# Patient Record
Sex: Male | Born: 1980 | Race: White | Hispanic: No | Marital: Single | Smoking: Never smoker
Health system: Southern US, Community
[De-identification: ages and names within clinical notes are randomized; demographics above are authoritative.]

## PROBLEM LIST (undated history)

## (undated) DIAGNOSIS — R51 Headache: Secondary | ICD-10-CM

## (undated) HISTORY — DX: Headache: R51

---

## 2012-11-22 ENCOUNTER — Other Ambulatory Visit: Payer: Self-pay

## 2012-11-22 DIAGNOSIS — F909 Attention-deficit hyperactivity disorder, unspecified type: Secondary | ICD-10-CM

## 2012-11-22 DIAGNOSIS — F988 Other specified behavioral and emotional disorders with onset usually occurring in childhood and adolescence: Secondary | ICD-10-CM

## 2012-11-22 MED ORDER — AMPHETAMINE-DEXTROAMPHETAMINE 30 MG PO TABS: ORAL_TABLET | ORAL | Status: DC

## 2012-11-22 NOTE — Telephone Encounter (Signed)
Mom called and wanted refill faxed to [facility] and them asked to mail hardcopy as well to: [facility] [address] [place], [place]  [address] Eric Zuniga

## 2012-12-24 ENCOUNTER — Other Ambulatory Visit: Payer: Self-pay

## 2012-12-24 DIAGNOSIS — F909 Attention-deficit hyperactivity disorder, unspecified type: Secondary | ICD-10-CM

## 2012-12-24 DIAGNOSIS — F988 Other specified behavioral and emotional disorders with onset usually occurring in childhood and adolescence: Secondary | ICD-10-CM

## 2012-12-24 MED ORDER — AMPHETAMINE-DEXTROAMPHETAMINE 30 MG PO TABS: ORAL_TABLET | ORAL | Status: DC

## 2012-12-24 NOTE — Telephone Encounter (Signed)
[name] lvm asking for refill for [place]

## 2013-01-27 ENCOUNTER — Other Ambulatory Visit: Payer: Self-pay

## 2013-01-27 DIAGNOSIS — F909 Attention-deficit hyperactivity disorder, unspecified type: Secondary | ICD-10-CM

## 2013-01-27 DIAGNOSIS — F988 Other specified behavioral and emotional disorders with onset usually occurring in childhood and adolescence: Secondary | ICD-10-CM

## 2013-01-27 MED ORDER — AMPHETAMINE-DEXTROAMPHETAMINE 30 MG PO TABS: ORAL_TABLET | ORAL | Status: DC

## 2013-01-27 NOTE — Telephone Encounter (Signed)
[name] lvm asking to fax to [facility] [phone] & mail to Attn Phil: [address] [facility] [address] [facility]. [place], [place] [address].

## 2013-02-26 ENCOUNTER — Telehealth: Payer: Self-pay

## 2013-02-26 DIAGNOSIS — F988 Other specified behavioral and emotional disorders with onset usually occurring in childhood and adolescence: Secondary | ICD-10-CM

## 2013-02-26 DIAGNOSIS — F909 Attention-deficit hyperactivity disorder, unspecified type: Secondary | ICD-10-CM

## 2013-02-26 MED ORDER — AMPHETAMINE-DEXTROAMPHETAMINE 30 MG PO TABS: ORAL_TABLET | ORAL | Status: DC

## 2013-02-26 NOTE — Telephone Encounter (Signed)
Rx  Is ready to be faxed and mailed. TG

## 2013-02-26 NOTE — Telephone Encounter (Signed)
[name] lvm asked that Rx be mailed to [facility] at [facility] and faxed:     [facility]    [address]    [place], [place]  [address]    Ph: [phone]    Fax: [phone]

## 2013-03-28 HISTORY — PX: REPLANTATION THUMB: SUR1233

## 2013-04-02 ENCOUNTER — Telehealth: Payer: Self-pay

## 2013-04-02 DIAGNOSIS — F988 Other specified behavioral and emotional disorders with onset usually occurring in childhood and adolescence: Secondary | ICD-10-CM

## 2013-04-02 DIAGNOSIS — F909 Attention-deficit hyperactivity disorder, unspecified type: Secondary | ICD-10-CM

## 2013-04-02 MED ORDER — AMPHETAMINE-DEXTROAMPHETAMINE 30 MG PO TABS: ORAL_TABLET | ORAL | Status: DC

## 2013-04-02 NOTE — Telephone Encounter (Signed)
[name] lvm asking for refills to be faxed and mailed to [facility], ATTN: Michele Mcalpine.

## 2013-04-02 NOTE — Telephone Encounter (Signed)
Demone's Rx is ready to be sent to the pharmacy. Thanks, Inetta Fermo

## 2013-04-02 NOTE — Telephone Encounter (Signed)
Faxed and mailed as requested.

## 2013-05-05 ENCOUNTER — Emergency Department: Payer: Self-pay | Admitting: Emergency Medicine

## 2013-05-06 SURGERY — NERVE, TENDON AND ARTERY REPAIR
Anesthesia: General | Laterality: Left

## 2013-05-07 ENCOUNTER — Inpatient Hospital Stay: Admit: 2013-05-07 | Payer: Self-pay | Admitting: Orthopedic Surgery

## 2013-05-12 ENCOUNTER — Telehealth: Payer: Self-pay

## 2013-05-12 DIAGNOSIS — F909 Attention-deficit hyperactivity disorder, unspecified type: Secondary | ICD-10-CM

## 2013-05-12 DIAGNOSIS — F988 Other specified behavioral and emotional disorders with onset usually occurring in childhood and adolescence: Secondary | ICD-10-CM

## 2013-05-12 MED ORDER — AMPHETAMINE-DEXTROAMPHETAMINE 30 MG PO TABS: ORAL_TABLET | ORAL | Status: DC

## 2013-05-12 NOTE — Telephone Encounter (Signed)
Called mom and let her know that I faxed the Rx as requested and also mailed.

## 2013-05-12 NOTE — Telephone Encounter (Signed)
Eric Zuniga called and said that she was at the pharmacy and the Rx was not there. She said that she called and left the request on a vm after scheduling the f/u appt last week. I apologized and said that I did not get the request and that it would be at the pharmacy later today. She expressed understanding. Eric Zuniga can be reached at [phone]. I will need to fax the Rx to Phil at [phone]. I will also need to mail it to him.

## 2013-06-12 ENCOUNTER — Telehealth: Payer: Self-pay

## 2013-06-12 DIAGNOSIS — F909 Attention-deficit hyperactivity disorder, unspecified type: Secondary | ICD-10-CM

## 2013-06-12 DIAGNOSIS — F988 Other specified behavioral and emotional disorders with onset usually occurring in childhood and adolescence: Secondary | ICD-10-CM

## 2013-06-12 MED ORDER — AMPHETAMINE-DEXTROAMPHETAMINE 30 MG PO TABS: ORAL_TABLET | ORAL | Status: DC

## 2013-06-12 NOTE — Telephone Encounter (Signed)
[name], mother, lvm stating that pt needed Rx mailed and faxed to [facility]. I called [name] to let her know we received her message and to check with her pharmacy later today.

## 2013-06-12 NOTE — Telephone Encounter (Signed)
Faxed and mailed as requested.

## 2013-06-12 NOTE — Telephone Encounter (Signed)
Signed.

## 2013-07-18 ENCOUNTER — Encounter: Payer: Self-pay | Admitting: Family

## 2013-07-18 DIAGNOSIS — F909 Attention-deficit hyperactivity disorder, unspecified type: Secondary | ICD-10-CM | POA: Insufficient documentation

## 2013-07-18 DIAGNOSIS — G43009 Migraine without aura, not intractable, without status migrainosus: Secondary | ICD-10-CM

## 2013-07-18 DIAGNOSIS — F988 Other specified behavioral and emotional disorders with onset usually occurring in childhood and adolescence: Secondary | ICD-10-CM

## 2013-07-21 ENCOUNTER — Ambulatory Visit: Payer: Self-pay | Admitting: Family

## 2013-07-21 ENCOUNTER — Ambulatory Visit (INDEPENDENT_AMBULATORY_CARE_PROVIDER_SITE_OTHER): Payer: PRIVATE HEALTH INSURANCE | Admitting: Family

## 2013-07-21 ENCOUNTER — Encounter: Payer: Self-pay | Admitting: Family

## 2013-07-21 VITALS — BP 120/70 | HR 68 | Ht 65.0 in | Wt 150.0 lb

## 2013-07-21 DIAGNOSIS — F909 Attention-deficit hyperactivity disorder, unspecified type: Secondary | ICD-10-CM

## 2013-07-21 DIAGNOSIS — G43009 Migraine without aura, not intractable, without status migrainosus: Secondary | ICD-10-CM

## 2013-07-21 DIAGNOSIS — F988 Other specified behavioral and emotional disorders with onset usually occurring in childhood and adolescence: Secondary | ICD-10-CM

## 2013-07-21 MED ORDER — AMPHETAMINE-DEXTROAMPHETAMINE 30 MG PO TABS: ORAL_TABLET | ORAL | Status: DC

## 2013-07-21 NOTE — Patient Instructions (Signed)
Continue your medication without change. Call me if you have any questions or concerns.  Please plan to return for follow up in 1 year or sooner if needed.

## 2013-07-21 NOTE — Progress Notes (Signed)
Patient: Eric Zuniga MRN: [id number] Sex: male DOB: 25-Jun-1981  Provider: Elveria Rising, NP Location of Care: [facility]  Note type: Routine return visit  History of Present Illness: Referral Source: Dr. Harlene Salts History from: patient Chief Complaint: ADHD/ADD/Migraines  Eric Zuniga is a 32 y.o. male with history of attention deficit disorder, inattentive type and rare migraine headaches. He is working as a Health visitor. He is taking and tolerating generic Adderall for his problems with attention and focus and says that his current dosage works well to control his symptoms.  He says that his appetite is good. He eats breakfast. He eats a small  lunch and then takes a dose afterwards to help him focus in the afternoon. He eats well at night. He says that he has no trouble sleeping well at night.  Eric denies headaches and says that it has been some time since he has had a migraine. He injured his thumb about 3 months ago while using a wood splitter and had surgery for that. He says that he is getting more normal range of motion and sensation back in his thumb now. 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 Eric has been otherwise healthy since last seen.   Review of Systems: 12 system review was unremarkable  Past Medical History  Diagnosis Date  . Headache(784.0)    Hospitalizations: no, Head Injury: yes, Nervous System Infections: no, Immunizations up to date: yes Past Medical History Comments: Patient suffered a head injury at the age of 8 due to a fall. He had an injury to his thumb early this year while using a wood splitter.   Surgical History Past Surgical History  Procedure Laterality Date  . Replantation thumb Left Aug. 2014    Family History family history includes Alzheimer's disease in his paternal grandmother; Heart attack in his paternal  grandfather. Family History is negative migraines, seizures, cognitive impairment, blindness, deafness, birth defects, chromosomal disorder, autism.  Social History History   Social History  . Marital Status: Single    Spouse Name: N/A    Number of Children: N/A  . Years of Education: N/A   Social History Main Topics  . Smoking status: Never Smoker   . Smokeless tobacco: Never Used  . Alcohol Use: No  . Drug Use: No  . Sexual Activity: Yes   Other Topics Concern  . None   Social History Narrative  . None   Educational level: university  Occupation: R Mining engineer Living with roommate   Hobbies/Interest: Coaches 2 soccer teams School comments: Eric Graduated from [employer] [place].  No Known Allergies  Physical Exam BP 120/70  Pulse 68  Ht 5\' 5"  (1.651 m)  Wt 150 lb (68.04 kg)  BMI 24.96 kg/m2 General: well developed, well nourished male, seated on exam table, in no evident distress Head: head  normocephalic and atraumatic.  Oropharynx benign. Ears, Nose and Throat: Oropharynx benign. Neck: supple with no carotid or supraclavicular bruits. Cardiovascular: regular rate and rhythm, no murmurs  Neurologic Exam  Mental Status: Awake and fully alert.  Oriented to place and time.  Recent and remote memory intact.  Attention span, concentration, and fund of knowledge appropriate.  Mood and affect appropriate. Cranial Nerves: Fundoscopic exam reveals sharp disc margins.  Pupils equal, briskly reactive to light.  Extraocular movements full without nystagmus.  Visual fields full to confrontation.  Hearing intact and symmetric  to finger rub.  Facial sensation intact.  Face, tongue, palate move normally and symmetrically.  Neck flexion and extension normal. Motor: Normal bulk and tone.  Normal strength in all tested extremity muscles. Sensory: Intact to touch and temperature in all extremities except for distal aspect left thumb where he has subjective diminished sensation  from the recent injury Coordination: Rapid alternating movements normal in all extremities.  Finger-to-nose and heel-to-shin performed accurately bilaterally. Romberg negative. Gait and Station: Arises from chair without difficulty.  Stance is normal.  Gait demonstrates normal stride length and balance.  Able to heel, toe, and tandem walk without difficulty. Reflexes: Diminished and symmetric.  Toes downgoing.  Assessment and Plan Eric is a 32 year old young man with history of attention deficit disorder, inattentive type and rare migraine headaches. He is doing well on his medication and we will make no changes in his treatment plan at this time. His weight is stable and he is mindful of need to not skip meals during the day. He will return for follow up in 1 year or sooner if needed.   Meds ordered this encounter  Medications  . amphetamine-dextroamphetamine (ADDERALL) 30 MG tablet    Sig: Take 1 tab by mouth in the morning and 1/2 tab by mouth in the afternoon    Dispense:  62 tablet    Refill:  0

## 2013-08-04 ENCOUNTER — Ambulatory Visit: Payer: Self-pay | Admitting: Family

## 2013-08-19 ENCOUNTER — Telehealth: Payer: Self-pay

## 2013-08-19 DIAGNOSIS — F988 Other specified behavioral and emotional disorders with onset usually occurring in childhood and adolescence: Secondary | ICD-10-CM

## 2013-08-19 DIAGNOSIS — F909 Attention-deficit hyperactivity disorder, unspecified type: Secondary | ICD-10-CM

## 2013-08-19 MED ORDER — AMPHETAMINE-DEXTROAMPHETAMINE 30 MG PO TABS: ORAL_TABLET | ORAL | Status: DC

## 2013-08-19 NOTE — Telephone Encounter (Signed)
[name], mom, lvm asking for Rx to be mailed to [facility] at [facility] and fax as well. I called mom and lvm letting her know we received her vm and will do it today.

## 2013-09-19 ENCOUNTER — Telehealth: Payer: Self-pay

## 2013-09-19 DIAGNOSIS — F988 Other specified behavioral and emotional disorders with onset usually occurring in childhood and adolescence: Secondary | ICD-10-CM

## 2013-09-19 DIAGNOSIS — F909 Attention-deficit hyperactivity disorder, unspecified type: Secondary | ICD-10-CM

## 2013-09-19 MED ORDER — AMPHETAMINE-DEXTROAMPHETAMINE 30 MG PO TABS: ORAL_TABLET | ORAL | Status: DC

## 2013-09-19 NOTE — Telephone Encounter (Signed)
[name], mom called, please fax generic Adderall Rx to [place] at [facility] and put hard copy in mail. Called mom to confirm receipt of message.

## 2013-09-19 NOTE — Telephone Encounter (Signed)
Faxed and mailed as requested.

## 2013-10-28 ENCOUNTER — Telehealth: Payer: Self-pay

## 2013-10-28 DIAGNOSIS — F988 Other specified behavioral and emotional disorders with onset usually occurring in childhood and adolescence: Secondary | ICD-10-CM

## 2013-10-28 DIAGNOSIS — F909 Attention-deficit hyperactivity disorder, unspecified type: Secondary | ICD-10-CM

## 2013-10-28 MED ORDER — AMPHETAMINE-DEXTROAMPHETAMINE 30 MG PO TABS: ORAL_TABLET | ORAL | Status: DC

## 2013-10-28 NOTE — Telephone Encounter (Signed)
Faxed and mailed as requested.

## 2013-10-28 NOTE — Telephone Encounter (Signed)
[name], mom, lvm asking that refill authorization be sent to pharmacy and hard copy mailed to [place] at [facility]. I called mom and lvm letting her know it would be done today.

## 2013-12-03 ENCOUNTER — Telehealth: Payer: Self-pay

## 2013-12-03 DIAGNOSIS — F988 Other specified behavioral and emotional disorders with onset usually occurring in childhood and adolescence: Secondary | ICD-10-CM

## 2013-12-03 DIAGNOSIS — F909 Attention-deficit hyperactivity disorder, unspecified type: Secondary | ICD-10-CM

## 2013-12-03 MED ORDER — AMPHETAMINE-DEXTROAMPHETAMINE 30 MG PO TABS: ORAL_TABLET | ORAL | Status: DC

## 2013-12-03 NOTE — Telephone Encounter (Signed)
[name], mom, lvm asking for refill to be sent to pharmacy via fax and mail hard copy to [facility]. I have addressed envelope in my office. Called mom to let her know we received msg and will send Rx today.

## 2013-12-03 NOTE — Telephone Encounter (Signed)
Faxed and mailed.

## 2014-01-01 ENCOUNTER — Other Ambulatory Visit: Payer: Self-pay

## 2014-01-01 DIAGNOSIS — F988 Other specified behavioral and emotional disorders with onset usually occurring in childhood and adolescence: Secondary | ICD-10-CM

## 2014-01-01 DIAGNOSIS — F909 Attention-deficit hyperactivity disorder, unspecified type: Secondary | ICD-10-CM

## 2014-01-01 MED ORDER — AMPHETAMINE-DEXTROAMPHETAMINE 30 MG PO TABS: ORAL_TABLET | ORAL | Status: DC

## 2014-01-01 NOTE — Telephone Encounter (Signed)
[name], mom, lvm asking for Rx for pt's generic Adderall 30 mg to be faxed to [facility] [phone] and mail original. Called [name] and lvm let her know it will be done today.

## 2014-01-01 NOTE — Telephone Encounter (Signed)
Faxed and mailed to pharmacy.

## 2014-02-02 ENCOUNTER — Telehealth: Payer: Self-pay

## 2014-02-02 DIAGNOSIS — F909 Attention-deficit hyperactivity disorder, unspecified type: Secondary | ICD-10-CM

## 2014-02-02 DIAGNOSIS — F988 Other specified behavioral and emotional disorders with onset usually occurring in childhood and adolescence: Secondary | ICD-10-CM

## 2014-02-02 MED ORDER — AMPHETAMINE-DEXTROAMPHETAMINE 30 MG PO TABS: ORAL_TABLET | ORAL | Status: DC

## 2014-02-02 NOTE — Telephone Encounter (Signed)
Faxed and mailed as requested.

## 2014-02-02 NOTE — Telephone Encounter (Signed)
[name], mom, lvm asking for pt's Rx for generic Adderall to be faxed to pharmacy, mail original to pharmacy. I called [name] and let her know the Rx will be sent.

## 2014-03-06 ENCOUNTER — Telehealth: Payer: Self-pay

## 2014-03-06 DIAGNOSIS — F909 Attention-deficit hyperactivity disorder, unspecified type: Secondary | ICD-10-CM

## 2014-03-06 DIAGNOSIS — F988 Other specified behavioral and emotional disorders with onset usually occurring in childhood and adolescence: Secondary | ICD-10-CM

## 2014-03-06 MED ORDER — AMPHETAMINE-DEXTROAMPHETAMINE 30 MG PO TABS: ORAL_TABLET | ORAL | Status: DC

## 2014-03-06 NOTE — Telephone Encounter (Signed)
This encounter was created in error - please disregard.

## 2014-03-06 NOTE — Telephone Encounter (Signed)
[name], mom, lvm asking for pt's Rx to be faxed and then mailed to [facility]. Called [name] and let her know I received the vm and that we will send out today as requested.

## 2014-03-06 NOTE — Telephone Encounter (Signed)
Faxed and mailed as requested.

## 2014-04-08 ENCOUNTER — Other Ambulatory Visit: Payer: Self-pay | Admitting: Family

## 2014-04-08 DIAGNOSIS — F988 Other specified behavioral and emotional disorders with onset usually occurring in childhood and adolescence: Secondary | ICD-10-CM

## 2014-04-08 DIAGNOSIS — F909 Attention-deficit hyperactivity disorder, unspecified type: Secondary | ICD-10-CM

## 2014-04-08 MED ORDER — AMPHETAMINE-DEXTROAMPHETAMINE 30 MG PO TABS: ORAL_TABLET | ORAL | Status: DC

## 2014-04-08 NOTE — Telephone Encounter (Signed)
Mom left a message requesting that the Rx be faxed and mailed to [facility] in [place]. I faxed and mailed the Rx as requested. TG

## 2014-05-08 ENCOUNTER — Other Ambulatory Visit: Payer: Self-pay | Admitting: Family

## 2014-05-08 DIAGNOSIS — F909 Attention-deficit hyperactivity disorder, unspecified type: Secondary | ICD-10-CM

## 2014-05-08 DIAGNOSIS — F988 Other specified behavioral and emotional disorders with onset usually occurring in childhood and adolescence: Secondary | ICD-10-CM

## 2014-05-08 MED ORDER — AMPHETAMINE-DEXTROAMPHETAMINE 30 MG PO TABS: ORAL_TABLET | ORAL | Status: DC

## 2014-05-08 NOTE — Telephone Encounter (Signed)
Eric Zuniga left message for Eric Zuniga that he needs Rx for generic Adderall faxed and mailed to [facility] can be reached at [phone]. Rx faxed and mailed as requested. TG

## 2014-06-15 ENCOUNTER — Telehealth: Payer: Self-pay | Admitting: Family

## 2014-06-15 DIAGNOSIS — F901 Attention-deficit hyperactivity disorder, predominantly hyperactive type: Secondary | ICD-10-CM

## 2014-06-15 DIAGNOSIS — F988 Other specified behavioral and emotional disorders with onset usually occurring in childhood and adolescence: Secondary | ICD-10-CM

## 2014-06-15 MED ORDER — AMPHETAMINE-DEXTROAMPHETAMINE 30 MG PO TABS: ORAL_TABLET | ORAL | Status: DC

## 2014-06-15 NOTE — Telephone Encounter (Signed)
Mom Eric Zuniga left message for Eric Zuniga. She asked for his generic Adderall Rx to be faxed & mailed to [facility] Attention Phil. Rx faxed and mailed as requested. TG

## 2014-07-16 ENCOUNTER — Telehealth: Payer: Self-pay

## 2014-07-16 DIAGNOSIS — F988 Other specified behavioral and emotional disorders with onset usually occurring in childhood and adolescence: Secondary | ICD-10-CM

## 2014-07-16 DIAGNOSIS — F901 Attention-deficit hyperactivity disorder, predominantly hyperactive type: Secondary | ICD-10-CM

## 2014-07-16 MED ORDER — AMPHETAMINE-DEXTROAMPHETAMINE 30 MG PO TABS: ORAL_TABLET | ORAL | Status: DC

## 2014-07-16 NOTE — Telephone Encounter (Signed)
Please ask if he could be here at 3:45 to check in on 12/7 or 12/17. If he can, I could see him at 4 either of those days. Thanks, Otila Kluver

## 2014-07-16 NOTE — Telephone Encounter (Signed)
[name] called back and lvm and said that she misunderstood [place] and he can only come in in the morning on 08/13/14, requested 8 am appt. I called [name] back and let her know that I have moved the appt to the 8 15 am slot w arrival time at 8 am. I asked that she call me back to confirm.

## 2014-07-16 NOTE — Telephone Encounter (Signed)
Eric Zuniga is scheduled for Dec 17th @ 4pm. Thanks, Otila Kluver

## 2014-07-16 NOTE — Telephone Encounter (Signed)
Called [name] to let her know Rx was being faxed and mailed. She said that he would like the appt on 08/13/14 @ 345 pm for a 4 pm.

## 2014-07-16 NOTE — Telephone Encounter (Signed)
[name], mom, called requesting pt's generic adderall Rx be faxed to [place] at [facility], mail hard copy to [employer]. [name] said that Eric Zuniga needs a late afternoon appt for f/u, as close to 4 pm as possible. He is available 12/7, 12/8, 12/17. Otila Kluver, there aren't any appts this late in the afternoon. Please advise and I will schedule when I call mom back. [phone].

## 2014-07-17 NOTE — Telephone Encounter (Signed)
Spoke with [name] and she said that patient will be out of town, she forgot about this yesterday. She asked about the last week of December, 4 pm appt. I let her know Otila Kluver was out of the office that week. [name] asked if Dr. Lemmie Evens had any openings during that week. Scheduled him for 08/24/14 @ 345 check in for 4 pm appt w Dr.H.

## 2014-08-13 ENCOUNTER — Ambulatory Visit: Payer: PRIVATE HEALTH INSURANCE | Admitting: Family

## 2014-08-24 ENCOUNTER — Encounter: Payer: Self-pay | Admitting: Pediatrics

## 2014-08-24 ENCOUNTER — Ambulatory Visit (INDEPENDENT_AMBULATORY_CARE_PROVIDER_SITE_OTHER): Payer: Managed Care, Other (non HMO) | Admitting: Pediatrics

## 2014-08-24 DIAGNOSIS — F900 Attention-deficit hyperactivity disorder, predominantly inattentive type: Secondary | ICD-10-CM

## 2014-08-24 DIAGNOSIS — G43009 Migraine without aura, not intractable, without status migrainosus: Secondary | ICD-10-CM

## 2014-08-24 DIAGNOSIS — F988 Other specified behavioral and emotional disorders with onset usually occurring in childhood and adolescence: Secondary | ICD-10-CM

## 2014-08-24 DIAGNOSIS — F901 Attention-deficit hyperactivity disorder, predominantly hyperactive type: Secondary | ICD-10-CM

## 2014-08-24 MED ORDER — AMPHETAMINE-DEXTROAMPHETAMINE 30 MG PO TABS: ORAL_TABLET | ORAL | Status: DC

## 2014-08-24 NOTE — Progress Notes (Signed)
Patient: Eric Zuniga MRN: [id number] Sex: male DOB: April 01, 1981  Provider: Jodi Geralds, MD Location of Care: [facility]  Note type: Routine return visit  History of Present Illness: Referral Source: Dr. Amaryllis Dyke History from: patient and [facility] chart Chief Complaint: ADHD/ADD/Migraines  Eric Zuniga is a 33 y.o. male who returns August 16, 2014, for the first time since July 21, 2013.  He has a history of attention deficit disorder inattentive type and migraine headaches.  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  He has taken and tolerated generic Adderall morning and afternoon on work days and less often on weekends (only when he has to work).  He is quite aware when he does not take the medication that he has issues with focus and makes errors, which can be problematic in his line of work.  His general health has been good.  He has no problems sleeping and a normal appetite.  He has lost nearly three pounds in the last year and looks well.  He has not experienced headaches particularly migraines in quite some time.  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  He played soccer in high school and college.  The only other concern today is that his blood pressure was minimally high.  He admits to having a diet that is fairly heavy in salt.  The patient has no primary care physician.  A 33 years of age, it is necessary for him to develop relationship with the primary care physician, and possibly initiate transition of care to provide his Adderall.  Unless or until we can create that arrangement, we will continue to see him on a yearly basis.  Review of Systems: 12 system review was unremarkable  Past Medical History Diagnosis Date  . Headache(784.0)    Hospitalizations: No., Head Injury: No., Nervous System Infections: No., Immunizations up to date: Yes.    Patient suffered  a head injury at the age of 30 due to a fall. He had an injury to his thumb 2014 while using a wood splitter.  Behavior History none  Surgical History Procedure Laterality Date  . Replantation thumb Left Aug. 2014   Family History family history includes Alzheimer's disease in his paternal grandmother; Heart attack in his paternal grandfather. Family history is negative for migraines, seizures, intellectual disabilities, blindness, deafness, birth defects, chromosomal disorder, or autism.  Social history . Marital Status: Single    Spouse Name: N/A    Number of Children: N/A  . Years of Education: N/A   Social History Main Topics  . Smoking status: Never Smoker   . Smokeless tobacco: Never Used  . Alcohol Use: No  . Drug Use: No  . Sexual Activity: Yes   Social History Narrative  Educational level university School Attending: N/A   Occupation: Distributor Living with roommate  Hobbies/Interest: coaching soccer School comments Eric works for [employer]. He graduated college in 2007 from [place] and [place] in 2009.  No Known Allergies  Physical Exam BP 128/80 mmHg  Ht 5' 5.75" (1.67 m)  Wt 147 lb 3.2 oz (66.769 kg)  BMI 23.94 kg/m2  General: alert, well developed, well nourished, in no acute distress, brown hair, blue eyes, right handed Head: normocephalic, no dysmorphic features Ears, Nose and Throat: Otoscopic: tympanic membranes normal; pharynx: oropharynx is pink without exudates or tonsillar hypertrophy Neck: supple, full range  of motion, no cranial or cervical bruits Respiratory: auscultation clear Cardiovascular: no murmurs, pulses are normal Musculoskeletal: no skeletal deformities or apparent scoliosis Skin: no rashes or neurocutaneous lesions  Neurologic Exam  Mental Status: alert; oriented to person, place and year; knowledge is normal for age; language is normal Cranial Nerves: visual fields are full to double  simultaneous stimuli; extraocular movements are full and conjugate; pupils are round reactive to light; funduscopic examination shows sharp disc margins with normal vessels; symmetric facial strength; midline tongue and uvula; air conduction is greater than bone conduction bilaterally Motor: Normal strength, tone and mass; good fine motor movements; no pronator drift Sensory: intact responses to cold, vibration, proprioception and stereognosis Coordination: good finger-to-nose, rapid repetitive alternating movements and finger apposition Gait and Station: normal gait and station: patient is able to walk on heels, toes and tandem without difficulty; balance is adequate; Romberg exam is negative; Gower response is negative Reflexes: symmetric and diminished bilaterally; no clonus; bilateral flexor plantar responses  Assessment 1. Attention deficit disorder, predominantly inattentive type, F90.0. 2. Migraine without aura and without status migrainosus, not intractable, G43.009.  Discussion Migraines are not an issue.  Attention-deficit disorder is mild and well controlled with Adderall.  Plan Prescription was issued for Adderall 30 mg tablets #62.  We will refill it on a monthly basis or somewhat longer than that because he does not use Adderall often on the weekends.  I spent 30 minutes of face-to-face time with the patient and more than half of it in consultation.  He will return in one-year if we are not able to find him or primary physician who will continue to provide this medication for him.   Medication List   This list is accurate as of: 08/24/14  4:03 PM.       amphetamine-dextroamphetamine 30 MG tablet  Commonly known as:  ADDERALL  Take 1 tab by mouth in the morning and 1/2 tab by mouth in the afternoon      The medication list was reviewed and reconciled. All changes or newly prescribed medications were explained.  A complete medication list was provided to the  patient/caregiver.  Jodi Geralds MD

## 2014-08-25 DIAGNOSIS — F988 Other specified behavioral and emotional disorders with onset usually occurring in childhood and adolescence: Secondary | ICD-10-CM | POA: Insufficient documentation

## 2014-09-22 ENCOUNTER — Other Ambulatory Visit: Payer: Self-pay

## 2014-09-22 DIAGNOSIS — F901 Attention-deficit hyperactivity disorder, predominantly hyperactive type: Secondary | ICD-10-CM

## 2014-09-22 MED ORDER — AMPHETAMINE-DEXTROAMPHETAMINE 30 MG PO TABS: ORAL_TABLET | ORAL | Status: DC

## 2014-09-22 NOTE — Telephone Encounter (Signed)
[name], mom, lvm requesting pt's Generic Adderall 30 mg 1 tab po q am & 1/2 tab po q pm. Please fax Rx to [facility] [phone] and then mail hard copy to the pharmacy. I called [name] back and lvm letting her know that I received her request and it will be processed.

## 2014-09-23 NOTE — Telephone Encounter (Signed)
This was faxed and mailed yesterday, 09/22/14.

## 2014-10-22 ENCOUNTER — Telehealth: Payer: Self-pay

## 2014-10-22 DIAGNOSIS — F901 Attention-deficit hyperactivity disorder, predominantly hyperactive type: Secondary | ICD-10-CM

## 2014-10-22 MED ORDER — AMPHETAMINE-DEXTROAMPHETAMINE 30 MG PO TABS: ORAL_TABLET | ORAL | Status: DC

## 2014-10-22 NOTE — Telephone Encounter (Signed)
[name], mom, called requesting Rx for patient's generic Adderall 30 mg be faxed and mailed to [facility]. I let her know that we would do this today as requested.

## 2014-10-22 NOTE — Telephone Encounter (Signed)
Faxed and mailed.

## 2014-11-18 ENCOUNTER — Telehealth: Payer: Self-pay

## 2014-11-18 DIAGNOSIS — F901 Attention-deficit hyperactivity disorder, predominantly hyperactive type: Secondary | ICD-10-CM

## 2014-11-18 MED ORDER — AMPHETAMINE-DEXTROAMPHETAMINE 30 MG PO TABS: ORAL_TABLET | ORAL | Status: DC

## 2014-11-18 NOTE — Telephone Encounter (Signed)
[name], mom, called requesting Rx for pt's generic Adderall 30 mg to be faxed and mailed to [place] at [facility]. I let her know we will do this today.

## 2014-11-18 NOTE — Telephone Encounter (Signed)
Faxed & mailed Rx as requested.

## 2014-12-24 ENCOUNTER — Other Ambulatory Visit: Payer: Self-pay | Admitting: Family

## 2014-12-24 DIAGNOSIS — F901 Attention-deficit hyperactivity disorder, predominantly hyperactive type: Secondary | ICD-10-CM

## 2014-12-24 MED ORDER — AMPHETAMINE-DEXTROAMPHETAMINE 30 MG PO TABS: ORAL_TABLET | ORAL | Status: DC

## 2014-12-24 NOTE — Telephone Encounter (Signed)
Mom Eric Zuniga left a message requesting that [name]'s Rx for Adderall be faxed and mailed to [facility] [place] [place] at [facility]. I sent the Rx as requested and called Mom to let her know. TG

## 2015-01-19 ENCOUNTER — Telehealth: Payer: Self-pay

## 2015-01-19 DIAGNOSIS — F901 Attention-deficit hyperactivity disorder, predominantly hyperactive type: Secondary | ICD-10-CM

## 2015-01-19 MED ORDER — AMPHETAMINE-DEXTROAMPHETAMINE 30 MG PO TABS: ORAL_TABLET | ORAL | Status: DC

## 2015-01-19 NOTE — Telephone Encounter (Signed)
Faxed and mailed as requested.

## 2015-01-19 NOTE — Telephone Encounter (Signed)
[name], mom, lvm requesting Rx for pt's generic Adderall 30 mg to be faxed and mailed to [facility]. I called mom and let her know we will send as requested.

## 2015-02-24 ENCOUNTER — Telehealth: Payer: Self-pay

## 2015-02-24 DIAGNOSIS — F901 Attention-deficit hyperactivity disorder, predominantly hyperactive type: Secondary | ICD-10-CM

## 2015-02-24 MED ORDER — AMPHETAMINE-DEXTROAMPHETAMINE 30 MG PO TABS: ORAL_TABLET | ORAL | Status: DC

## 2015-02-24 NOTE — Telephone Encounter (Signed)
[name], mom lvm requesting refill for pt's generic Adderall 30 mg to be faxed and mailed to pharmacy.

## 2015-02-24 NOTE — Telephone Encounter (Signed)
Called and informed mother that Rx was faxed and mailed as requested.

## 2015-03-23 ENCOUNTER — Telehealth: Payer: Self-pay

## 2015-03-23 DIAGNOSIS — F901 Attention-deficit hyperactivity disorder, predominantly hyperactive type: Secondary | ICD-10-CM

## 2015-03-23 MED ORDER — AMPHETAMINE-DEXTROAMPHETAMINE 30 MG PO TABS: ORAL_TABLET | ORAL | Status: DC

## 2015-03-23 NOTE — Telephone Encounter (Signed)
[name], mom, lvm requesting Rx for pt's generic Adderall 30 mg, be faxed/mailed to pharmacy.

## 2015-03-24 NOTE — Telephone Encounter (Signed)
Lvm for [name] letting her know the Rx was faxed and hard copy placed in the mail.

## 2015-04-29 ENCOUNTER — Other Ambulatory Visit: Payer: Self-pay

## 2015-04-29 DIAGNOSIS — F901 Attention-deficit hyperactivity disorder, predominantly hyperactive type: Secondary | ICD-10-CM

## 2015-04-29 MED ORDER — AMPHETAMINE-DEXTROAMPHETAMINE 30 MG PO TABS: ORAL_TABLET | ORAL | Status: DC

## 2015-04-29 NOTE — Telephone Encounter (Signed)
[name], mom, lvm requesting refill for patient's Amphetamine-dextroamphetamine XR 30 mg to be faxed and mailed to pharmacy. I lvm letting her know we will fax and mail today as requested.

## 2015-04-29 NOTE — Telephone Encounter (Signed)
Faxed and mailed.

## 2015-05-05 IMAGING — CR LEFT THUMB 2+V
1 series · 3 of 3 positions shown · non-contrast
Comparison: none

REASON FOR EXAM: injury
COMMENTS:   LMP: (Male)

[Series 1: x finger obl left · 0.14mm/px · 3 of 3 slices shown]
[im 1/3]
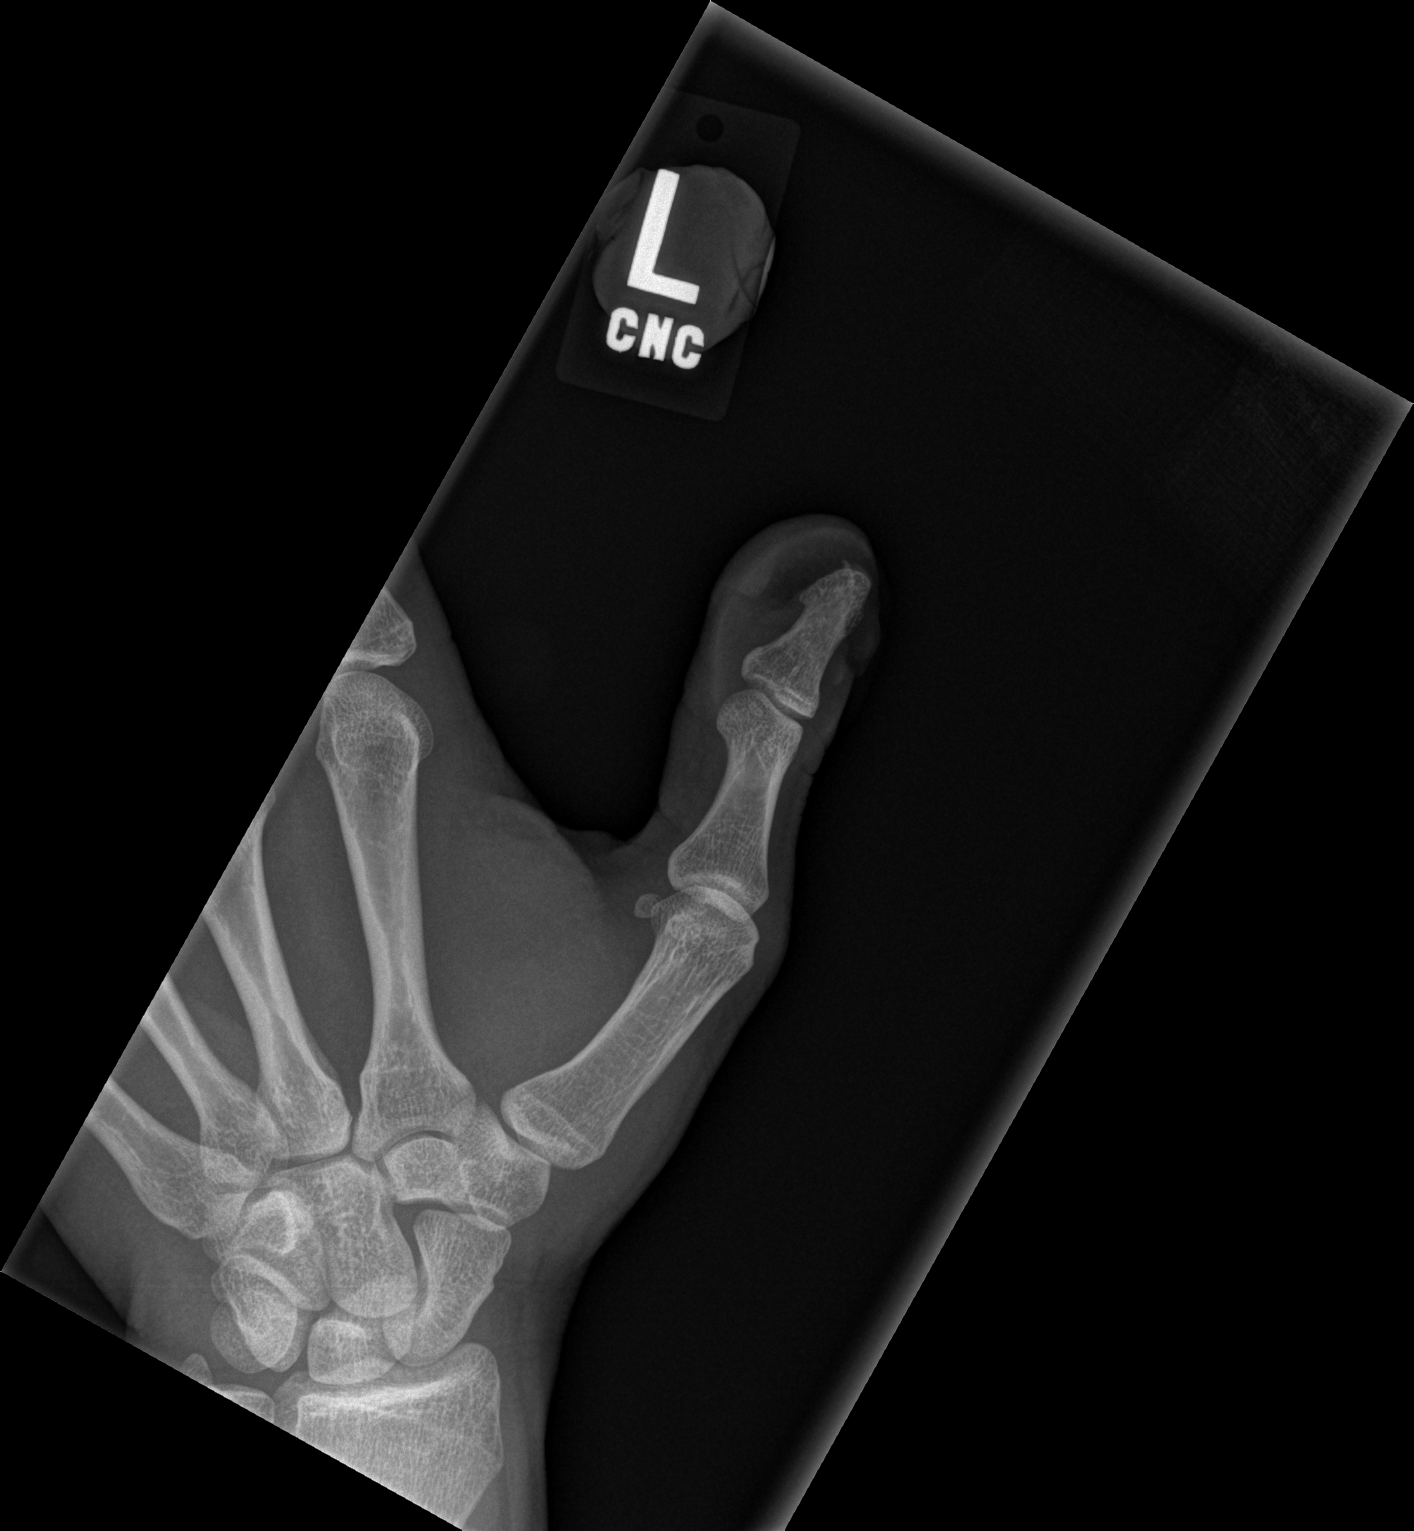
[im 2/3]
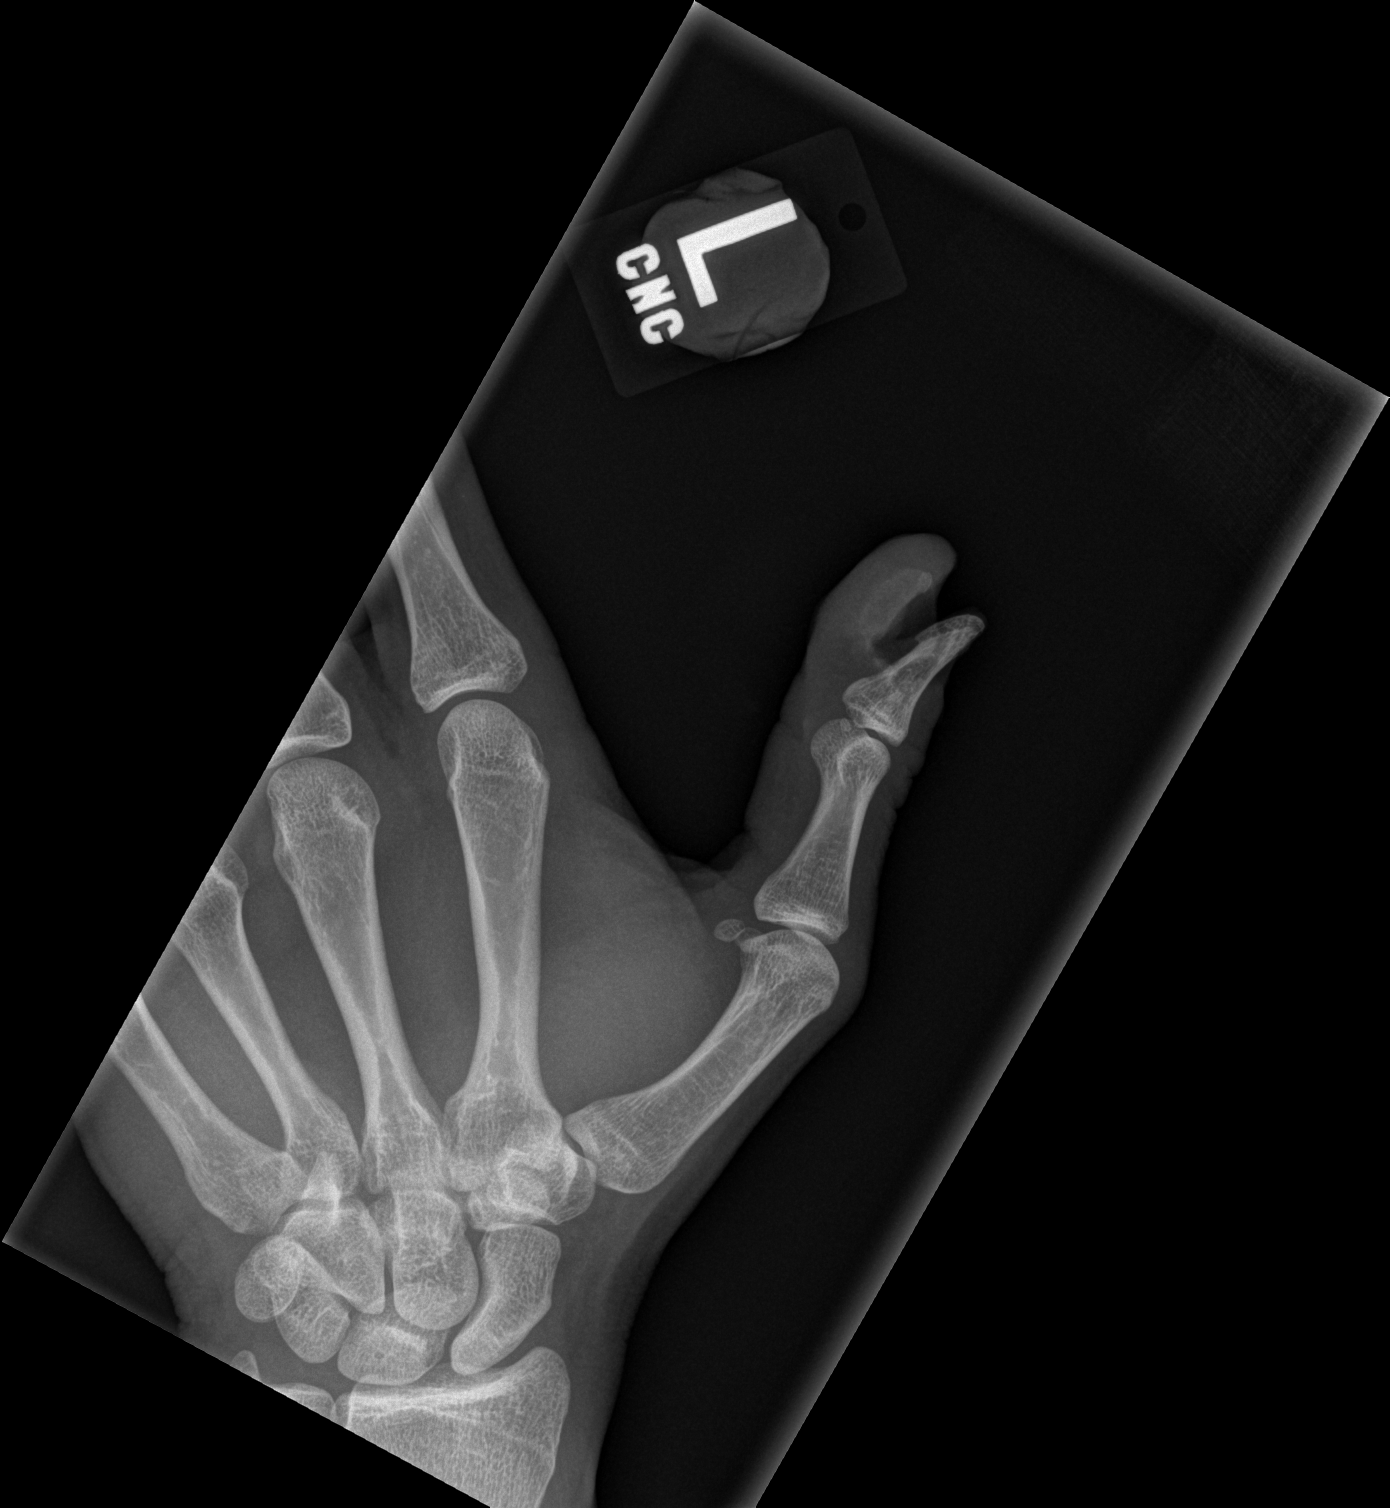
[im 3/3]
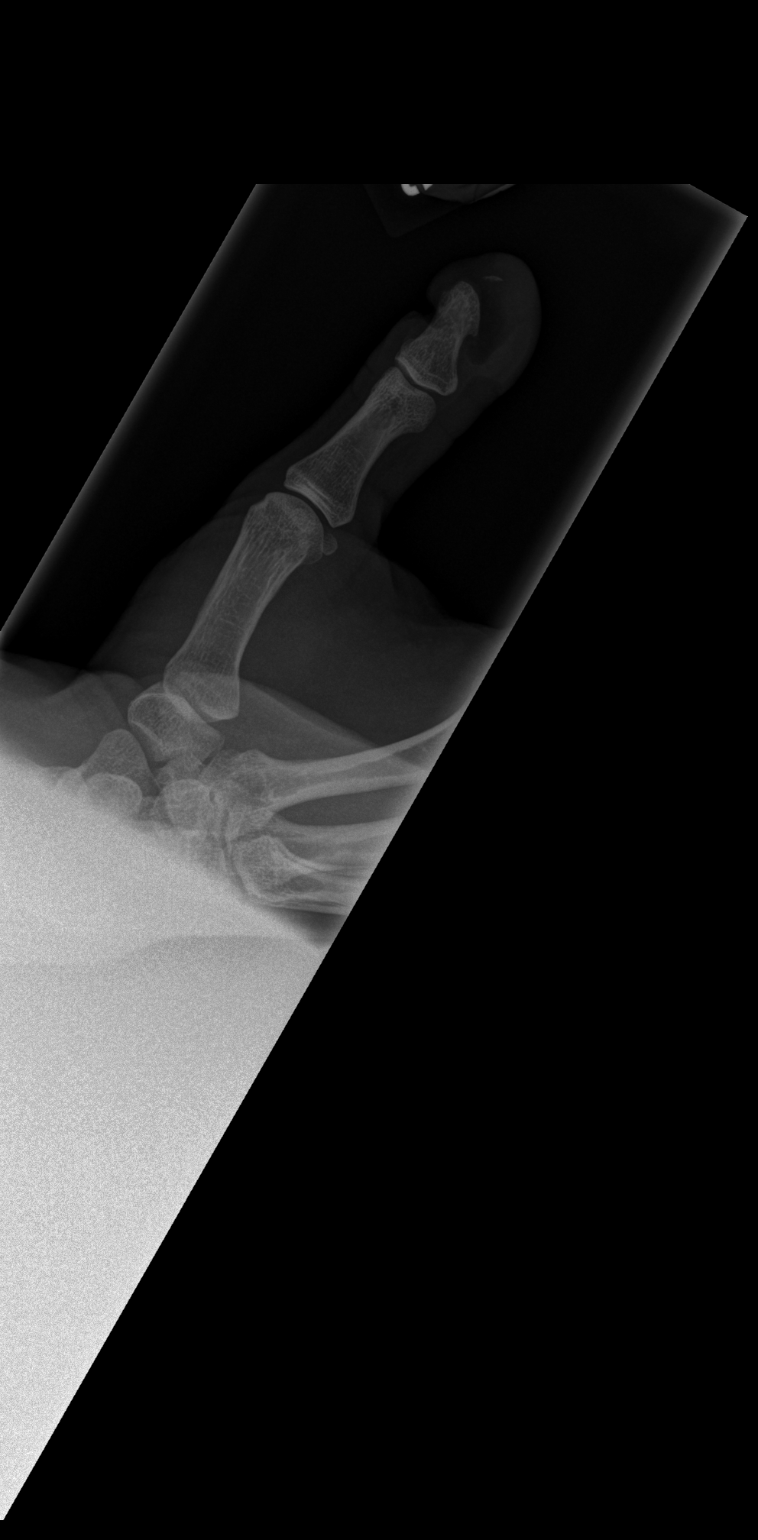

[3 of 3 positions shown; findings below may reference images not displayed]

PROCEDURE:     DXR - DXR THUMB LEFT HAND (1ST DIGIT)  - May 05, 2013  [DATE]

RESULT:     Three views of the left thumb reveal disruption of the soft
tissues over the distal aspect of the distal phalanx with exposure of the
distal phalanx. There is no acute fracture demonstrated. There is a small
amount of increased density in the soft tissues which may reflect foreign
material. The proximal phalanx appears intact.
IMPRESSION: Severe disruption of the soft tissues exposing the distal
phalanx is present. A definite acute displaced fracture is not seen. A tiny
avulsion from the tip of the tuft is not excluded.

[REDACTED]

## 2015-05-27 ENCOUNTER — Telehealth: Payer: Self-pay

## 2015-05-27 DIAGNOSIS — F901 Attention-deficit hyperactivity disorder, predominantly hyperactive type: Secondary | ICD-10-CM

## 2015-05-27 MED ORDER — AMPHETAMINE-DEXTROAMPHETAMINE 30 MG PO TABS: ORAL_TABLET | ORAL | Status: DC

## 2015-05-27 NOTE — Telephone Encounter (Signed)
Mailed and faxed as requested.

## 2015-05-27 NOTE — Telephone Encounter (Signed)
[name] called and requested Rx for pt's generic Adderall XR 30 mg to be faxed and mailed to [facility]. I called and let her know I will fax & mail as requested.

## 2015-06-25 ENCOUNTER — Other Ambulatory Visit: Payer: Self-pay | Admitting: Family

## 2015-06-25 DIAGNOSIS — F901 Attention-deficit hyperactivity disorder, predominantly hyperactive type: Secondary | ICD-10-CM

## 2015-06-25 MED ORDER — AMPHETAMINE-DEXTROAMPHETAMINE 30 MG PO TABS: ORAL_TABLET | ORAL | Status: DC

## 2015-06-25 NOTE — Telephone Encounter (Signed)
Rx was faxed and mailed to [facility] as requested. TG

## 2015-07-27 ENCOUNTER — Telehealth: Payer: Self-pay

## 2015-07-27 DIAGNOSIS — F901 Attention-deficit hyperactivity disorder, predominantly hyperactive type: Secondary | ICD-10-CM

## 2015-07-27 MED ORDER — AMPHETAMINE-DEXTROAMPHETAMINE 30 MG PO TABS: ORAL_TABLET | ORAL | Status: DC

## 2015-07-27 NOTE — Telephone Encounter (Signed)
Faxed and mailed as requested.

## 2015-07-27 NOTE — Telephone Encounter (Signed)
[name], mom, lvm requesting Rx for pt's generic aderall. Asked that we mail and fax to [employer] at [facility]. CB# [phone]. I called and lvm for [name] letting her know we will process the request. Asked that she call and schedule Eric Zuniga for a f/u visit in December.

## 2015-08-26 ENCOUNTER — Telehealth: Payer: Self-pay

## 2015-08-26 DIAGNOSIS — F901 Attention-deficit hyperactivity disorder, predominantly hyperactive type: Secondary | ICD-10-CM

## 2015-08-26 MED ORDER — AMPHETAMINE-DEXTROAMPHETAMINE 30 MG PO TABS: ORAL_TABLET | ORAL | Status: DC

## 2015-08-26 NOTE — Telephone Encounter (Signed)
I called mom and let her know that I have faxed and mailed the Rx as requested. I asked mom if she wanted [place] to hand deliver the rx, she declined and asked that we mail to the pharmacy. Scheduled f/u with Otila Kluver for tomorrow at 8:45 am with arrival at 8:30 am.

## 2015-08-26 NOTE — Telephone Encounter (Signed)
[name], mom, called requesting Rx for Eric Zuniga's Generic Adderall 30 mg. She is needs it faxed and maile to pharmacy. She is going to call [place] and see if he can come in tomorrow for a f/u. She wants him seen before the beginning of the year due to insurance deductible. Mom will call me back about that.

## 2015-08-27 ENCOUNTER — Encounter: Payer: Self-pay | Admitting: Family

## 2015-08-27 ENCOUNTER — Ambulatory Visit (INDEPENDENT_AMBULATORY_CARE_PROVIDER_SITE_OTHER): Payer: Managed Care, Other (non HMO) | Admitting: Family

## 2015-08-27 VITALS — BP 120/78 | HR 78 | Ht 65.0 in | Wt 145.8 lb

## 2015-08-27 DIAGNOSIS — F988 Other specified behavioral and emotional disorders with onset usually occurring in childhood and adolescence: Secondary | ICD-10-CM

## 2015-08-27 DIAGNOSIS — G43009 Migraine without aura, not intractable, without status migrainosus: Secondary | ICD-10-CM | POA: Diagnosis not present

## 2015-08-27 DIAGNOSIS — F900 Attention-deficit hyperactivity disorder, predominantly inattentive type: Secondary | ICD-10-CM | POA: Diagnosis not present

## 2015-08-27 NOTE — Patient Instructions (Signed)
Continue your medication as you have been taking it. Let me know if you have any questions or concerns, otherwise I will see you in follow up in 1 year or sooner if needed.

## 2015-08-27 NOTE — Progress Notes (Signed)
Patient: Eric Zuniga MRN: [id number] Sex: male DOB: Aug 07, 1981  Provider: Rockwell Germany, NP Location of Care: [facility]  Note type: Routine return visit  History of Present Illness: Referral Source:David Wynetta Emery, MD History from: patient and [place] chart Chief Complaint: ADHD/ADD/Migraines  Eric Fabin Cohick is a 34 y.o. young man with history of attention deficit disorder inattentive type and migraine headaches.He was last seen on August 24, 2014 by Dr. Gaynell Face. 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 He has taken and tolerated generic Adderall morning and afternoon on work days and less often on weekends (only when he has to work). When he does not take the medication, he has issues with focus and makes errors, which can be problematic in his line of work.   Eric reports to day that his general health has been good. He has no problems sleeping and a normal appetite. He had an ankle injury a few months ago that prevented him from exercise but he says that has resolved. He returned from a ski trip to [place] earlier this week.   Eric has not experienced migraine headaches in quite some time. He has no other health concerns today other than previously mentioned.  Review of Systems: Please see the HPI for neurologic and other pertinent review of systems. Otherwise, the following systems are noncontributory including constitutional, eyes, ears, nose and throat, cardiovascular, respiratory, gastrointestinal, genitourinary, musculoskeletal, skin, endocrine, hematologic/lymph, allergic/immunologic and psychiatric.   Past Medical History  Diagnosis Date  . Headache(784.0)    Hospitalizations: No., Head Injury: No., Nervous System Infections: No., Immunizations up to date: Yes.   Past Medical History Comments: Patient suffered a head injury at the age of 41 due to a fall. He had an injury to his thumb 2014 while using a wood  splitter that required surgical repair.  Surgical History Past Surgical History  Procedure Laterality Date  . Replantation thumb Left Aug. 2014    Family History family history includes Alzheimer's disease in his paternal grandmother; Heart attack in his paternal grandfather. Family History is otherwise negative for migraines, seizures, cognitive impairment, blindness, deafness, birth defects, chromosomal disorder, autism.  Social History Social History   Social History  . Marital Status: Single    Spouse Name: N/A  . Number of Children: N/A  . Years of Education: N/A   Social History Main Topics  . Smoking status: Never Smoker   . Smokeless tobacco: Never Used  . Alcohol Use: 0.0 oz/week    0 Standard drinks or equivalent per week  . Drug Use: No  . Sexual Activity: Yes   Other Topics Concern  . 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     Allergies No Known Allergies  Physical Exam BP 120/78 mmHg  Pulse 78  Ht 5\' 5"  (1.651 m)  Wt 145 lb 12.8 oz (66.134 kg)  BMI 24.26 kg/m2 General: well developed, well nourished young man, seated on exam table, in no evident distress Head: head normocephalic and atraumatic.  Oropharynx benign. Neck: supple with no carotid or supraclavicular bruits Cardiovascular: regular rate and rhythm, no murmurs Skin: No rashes or lesions  Neurologic Exam Mental Status: Awake and fully alert.  Oriented to place and time.  Recent and remote memory intact.  Attention span, concentration, and fund of knowledge appropriate.  Mood and affect appropriate. Cranial Nerves: Fundoscopic exam reveals sharp  disc margins.  Pupils equal, briskly reactive to light.  Extraocular movements full without nystagmus.  Visual fields full to confrontation.  Hearing intact and symmetric to finger rub.  Facial sensation intact.  Face tongue, palate move normally and  symmetrically.  Neck flexion and extension normal. Motor: Normal bulk and tone. Normal strength in all tested extremity muscles. Sensory: Intact to touch and temperature in all extremities.  Coordination: Rapid alternating movements normal in all extremities.  Finger-to-nose and heel-to shin performed accurately bilaterally.  Romberg negative. Gait and Station: Arises from chair without difficulty.  Stance is normal. Gait demonstrates normal stride length and balance.   Able to heel, toe and tandem walk without difficulty. Reflexes: Diminished and symmetric. Toes downgoing.  Impression 1. Attention deficit disorder, predominantly inattentive type 2. Migraine without aura and without status migrainosus, not intractable   Recommendations for plan of care The patient's previous [facility] records were reviewed. Eric has neither had nor required imaging or lab studies since the last visit. He is a 34 year old young man with history of attention deficit disorder and migraines. He is doing well on his current regimen of generic Adderall, and I will make no changes in his plan of care at this time. Eric will return for follow up in 1 year or sooner if needed.  The medication list was reviewed and reconciled.  No changes were made in the prescribed medications today.  A complete medication list was provided to the patient.   Total time spent with the patient was 20 minutes, of which 50% or more was spent in counseling and coordination of care.

## 2015-10-01 ENCOUNTER — Telehealth: Payer: Self-pay

## 2015-10-01 DIAGNOSIS — F901 Attention-deficit hyperactivity disorder, predominantly hyperactive type: Secondary | ICD-10-CM

## 2015-10-01 MED ORDER — AMPHETAMINE-DEXTROAMPHETAMINE 30 MG PO TABS: ORAL_TABLET | ORAL | Status: DC

## 2015-10-01 NOTE — Telephone Encounter (Signed)
[name], mom, lvm requesting Rx for son's generic Adderall 30 mg to be faxed and mailed to pharmacy. [phone] I called [name] and let her know we will send the Rx today.

## 2015-10-01 NOTE — Telephone Encounter (Signed)
Faxed and mailed as requested.

## 2015-10-01 NOTE — Telephone Encounter (Signed)
Noted, thank you

## 2015-11-01 ENCOUNTER — Other Ambulatory Visit: Payer: Self-pay | Admitting: *Deleted

## 2015-11-01 DIAGNOSIS — F901 Attention-deficit hyperactivity disorder, predominantly hyperactive type: Secondary | ICD-10-CM

## 2015-11-01 MED ORDER — AMPHETAMINE-DEXTROAMPHETAMINE 30 MG PO TABS: ORAL_TABLET | ORAL | Status: DC

## 2015-11-01 NOTE — Telephone Encounter (Signed)
Called and spoke to Eric Zuniga. Let her know Rx was faxed and I will be putting it in the mail to the pharmacy today.

## 2015-11-01 NOTE — Telephone Encounter (Signed)
Mom called and states that Eric Zuniga needs a refill on his Adderall. She states she would like Rx faxed to the pharmacy on file and then the hard copy mailed to the pharmacy.   CB: [phone]

## 2015-11-30 ENCOUNTER — Telehealth: Payer: Self-pay

## 2015-11-30 DIAGNOSIS — F901 Attention-deficit hyperactivity disorder, predominantly hyperactive type: Secondary | ICD-10-CM

## 2015-11-30 MED ORDER — AMPHETAMINE-DEXTROAMPHETAMINE 30 MG PO TABS: ORAL_TABLET | ORAL | Status: DC

## 2015-11-30 NOTE — Telephone Encounter (Signed)
[name], mom, lvm requesting Rx for patient's generic Adderall to be faxed and mailed to [facility]. CB # [phone]

## 2015-11-30 NOTE — Telephone Encounter (Signed)
I faxed and mailed the Rx as requested.

## 2016-01-04 ENCOUNTER — Telehealth: Payer: Self-pay

## 2016-01-04 DIAGNOSIS — F901 Attention-deficit hyperactivity disorder, predominantly hyperactive type: Secondary | ICD-10-CM

## 2016-01-04 MED ORDER — AMPHETAMINE-DEXTROAMPHETAMINE 30 MG PO TABS: ORAL_TABLET | ORAL | Status: DC

## 2016-01-04 NOTE — Telephone Encounter (Signed)
[name], mom, called requesting Rx for patient's Amphetamine-dextroamphetamine 30 mg to be faxed and mailed to [facility]. She said that she lm on one of our vm's last week, and became worried when she did not hear back form our office. I let her know that if we do not call her back, then we did not receive the vm and she should call us again. She expressed understanding.

## 2016-01-04 NOTE — Telephone Encounter (Signed)
Faxed and mailed as requested.

## 2016-02-07 ENCOUNTER — Telehealth: Payer: Self-pay

## 2016-02-07 DIAGNOSIS — F901 Attention-deficit hyperactivity disorder, predominantly hyperactive type: Secondary | ICD-10-CM

## 2016-02-07 MED ORDER — AMPHETAMINE-DEXTROAMPHETAMINE 30 MG PO TABS: ORAL_TABLET | ORAL | Status: DC

## 2016-02-07 NOTE — Telephone Encounter (Signed)
[name], mom, lvm requesting Rx for patient's generic amphetamine-dextroamphetamine 30 mg tablets to be faxed and  mailed to [place] [facility]. CB# [phone]

## 2016-02-07 NOTE — Telephone Encounter (Signed)
Faxed and placed in the mail.

## 2016-03-07 ENCOUNTER — Telehealth: Payer: Self-pay

## 2016-03-07 DIAGNOSIS — F901 Attention-deficit hyperactivity disorder, predominantly hyperactive type: Secondary | ICD-10-CM

## 2016-03-07 MED ORDER — AMPHETAMINE-DEXTROAMPHETAMINE 30 MG PO TABS: ORAL_TABLET | ORAL | Status: DC

## 2016-03-07 NOTE — Telephone Encounter (Signed)
[name] lvm requesting Rx for patient's Amphetamine-dextroamphetamine 30 mg to be faxed and mailed to [facility]. Lvm for mom letting her know Rx will be faxed and mailed as requested.

## 2016-03-07 NOTE — Telephone Encounter (Signed)
Faxed and mailed as requested.

## 2016-04-11 ENCOUNTER — Other Ambulatory Visit: Payer: Self-pay

## 2016-04-11 DIAGNOSIS — F901 Attention-deficit hyperactivity disorder, predominantly hyperactive type: Secondary | ICD-10-CM

## 2016-04-11 MED ORDER — AMPHETAMINE-DEXTROAMPHETAMINE 30 MG PO TABS: ORAL_TABLET | ORAL | 0 refills | Status: DC

## 2016-04-11 NOTE — Telephone Encounter (Signed)
[name], mom, lvm requesting Rx for pt's generic Adderall to be faxed and mailed to [facility].  I called [name] and let her know we will end as requested.

## 2016-04-11 NOTE — Telephone Encounter (Signed)
Faxed and mailed copy to pharmacy.

## 2016-05-10 ENCOUNTER — Other Ambulatory Visit: Payer: Self-pay

## 2016-05-10 DIAGNOSIS — F901 Attention-deficit hyperactivity disorder, predominantly hyperactive type: Secondary | ICD-10-CM

## 2016-05-10 MED ORDER — AMPHETAMINE-DEXTROAMPHETAMINE 30 MG PO TABS: ORAL_TABLET | ORAL | 0 refills | Status: DC

## 2016-05-10 NOTE — Telephone Encounter (Signed)
Faxed and mailed as requested.

## 2016-05-10 NOTE — Telephone Encounter (Signed)
[name], mom, lvm requesting Rx for pt's Amphetamine-dextroamphetamine 30 mg to be faxed and mailed to [facility] [phone] I lvm for [name] letting her know that I will fax and mail as requested.

## 2016-06-09 ENCOUNTER — Telehealth (INDEPENDENT_AMBULATORY_CARE_PROVIDER_SITE_OTHER): Payer: Self-pay

## 2016-06-09 DIAGNOSIS — F901 Attention-deficit hyperactivity disorder, predominantly hyperactive type: Secondary | ICD-10-CM

## 2016-06-09 MED ORDER — AMPHETAMINE-DEXTROAMPHETAMINE 30 MG PO TABS: ORAL_TABLET | ORAL | 0 refills | Status: DC

## 2016-06-09 NOTE — Telephone Encounter (Signed)
Mother called to get a refill on Adderall 30 mg. She states that he will be going out of town today. She would like for it to be faxed to the pharmacy but also a hard copy mailed to the address in his chart.  CB:[phone]

## 2016-06-09 NOTE — Telephone Encounter (Signed)
Rx has been faxed and mailed to the pharmacy

## 2016-07-11 ENCOUNTER — Telehealth (INDEPENDENT_AMBULATORY_CARE_PROVIDER_SITE_OTHER): Payer: Self-pay

## 2016-07-11 DIAGNOSIS — F901 Attention-deficit hyperactivity disorder, predominantly hyperactive type: Secondary | ICD-10-CM

## 2016-07-11 MED ORDER — AMPHETAMINE-DEXTROAMPHETAMINE 30 MG PO TABS: ORAL_TABLET | ORAL | 0 refills | Status: DC

## 2016-07-11 NOTE — Telephone Encounter (Signed)
Rx has been placed upfront to be mailed

## 2016-07-11 NOTE — Telephone Encounter (Signed)
Patient's mother called for a refill on Adderall 30 mg. She asks that we mail him a hard copy but also fax over the rx to thepharmacy.   CB:[phone]

## 2016-08-17 ENCOUNTER — Telehealth (INDEPENDENT_AMBULATORY_CARE_PROVIDER_SITE_OTHER): Payer: Self-pay

## 2016-08-17 DIAGNOSIS — F901 Attention-deficit hyperactivity disorder, predominantly hyperactive type: Secondary | ICD-10-CM

## 2016-08-17 MED ORDER — AMPHETAMINE-DEXTROAMPHETAMINE 30 MG PO TABS: ORAL_TABLET | ORAL | 0 refills | Status: DC

## 2016-08-17 NOTE — Telephone Encounter (Signed)
Patient's mother, Suzi Roots, called for a refill on Adderall 30 mg. She requested that it be faxed to the pharmacy and a hard copy mailed to the pharmacy as well.    CB:[phone]

## 2016-08-18 ENCOUNTER — Telehealth (INDEPENDENT_AMBULATORY_CARE_PROVIDER_SITE_OTHER): Payer: Self-pay | Admitting: Family

## 2016-08-18 NOTE — Telephone Encounter (Signed)
-----   Message from Rockwell Germany, NP sent at 08/17/2016  4:21 PM EST ----- Regarding: Needs appointment [place] needs an appointment with me.  Thanks,  Otila Kluver

## 2016-08-18 NOTE — Telephone Encounter (Signed)
Spoke with Suzi Roots (mom), she did not have calendar to schedule appt and will call back on Tuesday, Dec 26 to make appt

## 2016-08-22 ENCOUNTER — Other Ambulatory Visit (INDEPENDENT_AMBULATORY_CARE_PROVIDER_SITE_OTHER): Payer: Self-pay | Admitting: Pediatrics

## 2016-08-22 ENCOUNTER — Telehealth (INDEPENDENT_AMBULATORY_CARE_PROVIDER_SITE_OTHER): Payer: Self-pay

## 2016-08-22 DIAGNOSIS — F901 Attention-deficit hyperactivity disorder, predominantly hyperactive type: Secondary | ICD-10-CM

## 2016-08-22 MED ORDER — AMPHETAMINE-DEXTROAMPHETAMINE 30 MG PO TABS: ORAL_TABLET | ORAL | 0 refills | Status: DC

## 2016-08-22 NOTE — Telephone Encounter (Signed)
Rx has been faxed over.

## 2016-08-22 NOTE — Telephone Encounter (Signed)
Patient's mother states that a rx needs to be faxed over to the pharmacy. I mailed the hard copy to the pharmacy but thought Otila Kluver faxed it over.I was never faxed. He is out of medication and needs it as soon as possible.   [phone]

## 2016-08-22 NOTE — Progress Notes (Signed)
I filled the prescription for 1 month (45 tablets). Patient to discuss with Otila Kluver regarding further prescriptions.   Carylon Perches MD MPH [facility] Neurology, Neurodevelopment and [facility]  [place], [place], [place] [address] Phone: [phone]

## 2016-08-22 NOTE — Telephone Encounter (Signed)
I reviewed your note, thank you

## 2016-09-19 ENCOUNTER — Other Ambulatory Visit (INDEPENDENT_AMBULATORY_CARE_PROVIDER_SITE_OTHER): Payer: Self-pay

## 2016-09-19 DIAGNOSIS — F901 Attention-deficit hyperactivity disorder, predominantly hyperactive type: Secondary | ICD-10-CM

## 2016-09-19 MED ORDER — AMPHETAMINE-DEXTROAMPHETAMINE 30 MG PO TABS: ORAL_TABLET | ORAL | 0 refills | Status: DC

## 2016-09-19 NOTE — Telephone Encounter (Signed)
[name], mom, called requesting Rx refill for Eric Zuniga's Amphetamine-dextroamphetamine 30 mg. She said that [facility] said that it can now be e-prescribed. If our office is able to do this, then we would no longer need to print/fax/mail the prescription, and mom would no longer need to call for the refills. I told [name] that I would find out if our provider's are able to do this. [name] said another option would be to write 3 one month scripts and send it to the pharmacy and they will place it on file. I told her that I did not think our office policy would allow for this, but that I would inquire. [name] said that if the E-Rx is not an option she would like [place] to send the refill to [facility], as we have in the past. She also stated that Eric Zuniga now how a new insurance for prescriptions. They told her that they will not cover the medication without a letter from the provider stating that Eric Zuniga needs the medication and that he has failed other medications in the past. Eric Zuniga has a f/u visit scheduled with Otila Kluver on 2.2.18.   CB# [phone]

## 2016-09-19 NOTE — Telephone Encounter (Signed)
I lvm letting mom know that I faxed and mailed the Rx to [facility]. I included in the message that per office policy, we can only send in 1 month refills at a time for controlled substances such as Adderall. I also included in the message that as I previously discussed with her, the pharmacy will need to send the denial to our office so Eric Zuniga can work on a PA or mom can send [place] a copy of front and back of new insurance card. I asked her to return my call to let me know what other medications Eric Zuniga has tried and failed. I will await the return call.

## 2016-09-19 NOTE — Telephone Encounter (Addendum)
Please let Eric Zuniga's Mom know that we cannot e-prescribe controlled medications such as Adderall - our EMR will not allow [place] to do it. We will need to continue to fax and mail the Rx's as we have been doing. We will also not be doing the 3 month prescriptions but will continue to do 1 month at time. As far as Theme park manager, when the pharmacy attempts to fill the prescription, it should give a rejection with the insurance information for me to attempt to get the medication authorized. If it does not, Eric Zuniga or his mother will need to provide a copy of the front and back of his new insurance card so that I can work on this. Also, in order to get the medication authorized, I will need to know what medications Eric Zuniga has tried and failed. I looked through this chart and [employer] and found that he took Adderall XR and Dexedrine in the past. Please ask Mom if she remembers names of any other medications that he took to focus his attention as that will help with getting the current medication approved. Thanks, Eric Zuniga

## 2016-09-21 NOTE — Telephone Encounter (Signed)
I called [name] and she confirmed receiving my vm on 1.23.18. She said that pt's Rx went through insurance without issue. She said that she will let us know if this changes in the future.

## 2016-09-28 ENCOUNTER — Encounter (INDEPENDENT_AMBULATORY_CARE_PROVIDER_SITE_OTHER): Payer: Self-pay | Admitting: Family

## 2016-09-28 NOTE — Progress Notes (Signed)
Patient: Eric Zuniga MRN: [id number] Sex: male DOB: 11-21-1980  Provider: Rockwell Germany, NP Location of Care: [facility]  Note type: Routine return visit  History of Present Illness: Referral Source: Amaryllis Dyke, MD History from: patient and [facility] chart Chief Complaint: ADHD/ADD/Migraines  Eric Zuniga is a 36 y.o. young man with history of attention deficit disorder inattentive type and migraine headaches.He was last seen on August 27, 2015. 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 He has taken and tolerated generic Adderall morning and afternoon on work days and less often on weekends (only when he has to work). When he does not take the medication, he has issues with focus and makes errors. Eric tells me today that he has been approached about a position that would require more time at a desk, and he had questions about the Adderall if his job was less physically active as it is now.   Eric reports to day that his general health has been good. He has no problems sleeping and a normal appetite. He exercises regularly and plays soccer.   Eric has not experienced migraine headaches in quite some time. He has no other health concerns today other than previously mentioned.  Review of Systems: Please see the HPI for neurologic and other pertinent review of systems. Otherwise, the following systems are noncontributory including constitutional, eyes, ears, nose and throat, cardiovascular, respiratory, gastrointestinal, genitourinary, musculoskeletal, skin, endocrine, hematologic/lymph, allergic/immunologic and psychiatric.   Past Medical History:  Diagnosis Date  . Headache(784.0)    Hospitalizations: No., Head Injury: No., Nervous System Infections: No., Immunizations up to date: Yes.   Past Medical History Comments: Patient suffered a head injury at the age of 14 due to a fall. He had an injury to his thumb 2014 while  using a wood splitter that required surgical repair.  Surgical History Past Surgical History:  Procedure Laterality Date  . REPLANTATION THUMB Left Aug. 2014    Family History family history includes Alzheimer's disease in his paternal grandmother; Heart attack in his paternal grandfather. Family History is otherwise negative for migraines, seizures, cognitive impairment, blindness, deafness, birth defects, chromosomal disorder, autism.  Social History Social History   Social History  . Marital status: Single    Spouse name: N/A  . Number of children: N/A  . Years of education: N/A   Social History Main Topics  . Smoking status: Never Smoker  . Smokeless tobacco: Never Used  . Alcohol use No  . Drug use: No  . Sexual activity: Yes   Other Topics Concern  . 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     Allergies No Known Allergies  Physical Exam BP 114/70   Pulse 76   Ht 5\' 5"  (1.651 m)   Wt 143 lb 6.4 oz (65 kg)   BMI 23.86 kg/m  General: well developed, well nourished young man, seated on exam table, in no evident distress Head: head normocephalic and atraumatic.  Oropharynx benign. Neck: supple with no carotid or supraclavicular bruits Cardiovascular: regular rate and rhythm, no murmurs Skin: No rashes or lesions  Neurologic Exam Mental Status: Awake and fully alert.  Oriented to place and time.  Recent and remote memory intact.  Attention span, concentration, and fund of knowledge appropriate.  Mood and affect appropriate. Cranial Nerves: Fundoscopic exam reveals sharp disc margins.  Pupils equal, briskly  reactive to light.  Extraocular movements full without nystagmus.  Visual fields full to confrontation.  Hearing intact and symmetric to finger rub.  Facial sensation intact.  Face tongue, palate move normally and symmetrically.  Neck flexion and extension  normal. Motor: Normal bulk and tone. Normal strength in all tested extremity muscles. Sensory: Intact to touch and temperature in all extremities.  Coordination: Rapid alternating movements normal in all extremities.  Finger-to-nose and heel-to shin performed accurately bilaterally.  Romberg negative. Gait and Station: Arises from chair without difficulty.  Stance is normal. Gait demonstrates normal stride length and balance.   Able to heel, toe and tandem walk without difficulty. Reflexes: Diminished and symmetric. Toes downgoing.  Impression 1. Attention deficit disorder, predominantly inattentive type   2. Migraine without aura and without status migrainosus, not intractable   Recommendations for plan of care The patient's previous [facility] records were reviewed. Eric has neither had nor required imaging or lab studies since the last visit. He is a 36 year old young man with history of attention deficit disorder and migraines. He is doing well on his current regimen of generic Adderall, and I will make no changes in his plan of care at this time. We talked about his concern about changing to a job that was less physically demanding and required more time at a desk. I told Eric that his medication would likely continue to be effective for him but that he might have to take frequent breaks from sitting if he changes jobs to help with his attention and focus. I told him to let me know if he has problems otherwise Eric will return for follow up in 1 year or sooner if needed  The medication list was reviewed and reconciled.  No changes were made in the prescribed medications today.  A complete medication list was provided to the patient.  Allergies as of 09/29/2016   No Known Allergies     Medication List       Accurate as of 09/28/16 12:06 PM. Always use your most recent med list.          amphetamine-dextroamphetamine 30 MG tablet Commonly known as:  ADDERALL Take 1 tab by mouth in the  morning and 1/2 tab by mouth in the afternoon       Total time spent with the patient was 20 minutes, of which 50% or more was spent in counseling and coordination of care.   Rockwell Germany NP-C

## 2016-09-29 ENCOUNTER — Encounter (INDEPENDENT_AMBULATORY_CARE_PROVIDER_SITE_OTHER): Payer: Self-pay | Admitting: *Deleted

## 2016-09-29 ENCOUNTER — Encounter (INDEPENDENT_AMBULATORY_CARE_PROVIDER_SITE_OTHER): Payer: Self-pay | Admitting: Family

## 2016-09-29 ENCOUNTER — Ambulatory Visit (INDEPENDENT_AMBULATORY_CARE_PROVIDER_SITE_OTHER): Payer: 59 | Admitting: Family

## 2016-09-29 VITALS — BP 114/70 | HR 76 | Ht 65.0 in | Wt 143.4 lb

## 2016-09-29 DIAGNOSIS — G43009 Migraine without aura, not intractable, without status migrainosus: Secondary | ICD-10-CM | POA: Diagnosis not present

## 2016-09-29 DIAGNOSIS — F988 Other specified behavioral and emotional disorders with onset usually occurring in childhood and adolescence: Secondary | ICD-10-CM | POA: Diagnosis not present

## 2016-09-29 NOTE — Patient Instructions (Signed)
Continue taking the Adderall as you have been taking it. Let me know if you have any problems with focus and attention in the future.   Please plan on returning for follow up in 1 year or sooner if needed.

## 2016-10-25 ENCOUNTER — Other Ambulatory Visit (INDEPENDENT_AMBULATORY_CARE_PROVIDER_SITE_OTHER): Payer: Self-pay

## 2016-10-25 DIAGNOSIS — F901 Attention-deficit hyperactivity disorder, predominantly hyperactive type: Secondary | ICD-10-CM

## 2016-10-25 MED ORDER — AMPHETAMINE-DEXTROAMPHETAMINE 30 MG PO TABS: ORAL_TABLET | ORAL | 0 refills | Status: DC

## 2016-10-25 NOTE — Telephone Encounter (Signed)
Faxed and mailed Rx to [facility].

## 2016-10-25 NOTE — Telephone Encounter (Signed)
[name], mom, called requesting refill for Eric Zuniga's Amphetamine-dextroamphetamine XR 30 mg. She asked that we fax and mail it to [facility]. I told her to check with them later today for the refill. Sh expressed understanding.

## 2016-11-22 ENCOUNTER — Telehealth (INDEPENDENT_AMBULATORY_CARE_PROVIDER_SITE_OTHER): Payer: Self-pay | Admitting: Neurology

## 2016-11-22 DIAGNOSIS — F901 Attention-deficit hyperactivity disorder, predominantly hyperactive type: Secondary | ICD-10-CM

## 2016-11-22 MED ORDER — AMPHETAMINE-DEXTROAMPHETAMINE 30 MG PO TABS: ORAL_TABLET | ORAL | 0 refills | Status: DC

## 2016-11-22 NOTE — Telephone Encounter (Signed)
I called [name] and let her know that I faxed the Rx and placed original in the mail.

## 2016-11-22 NOTE — Telephone Encounter (Signed)
Rx will be printed, faxed and mailed to [facility]. Pt was seen on 2.2.18, recall set for 2.2.19.

## 2016-11-22 NOTE — Telephone Encounter (Signed)
Prescription signed and placed on your desk.

## 2016-11-22 NOTE — Telephone Encounter (Signed)
°  Who's calling (name and relationship to patient) :  Best contact number: [phone] Provider they see: Ceylon Hawks Reason for call: Do the Adderall the way it has been done. Please call    PRESCRIPTION REFILL ONLY  Name of prescription:  Pharmacy:

## 2016-12-22 ENCOUNTER — Telehealth (INDEPENDENT_AMBULATORY_CARE_PROVIDER_SITE_OTHER): Payer: Self-pay | Admitting: Family

## 2016-12-22 DIAGNOSIS — F901 Attention-deficit hyperactivity disorder, predominantly hyperactive type: Secondary | ICD-10-CM

## 2016-12-22 MED ORDER — AMPHETAMINE-DEXTROAMPHETAMINE 30 MG PO TABS: ORAL_TABLET | ORAL | 0 refills | Status: DC

## 2016-12-22 NOTE — Telephone Encounter (Signed)
°  Who's calling (name and relationship to patient) : [name] (mom)  Best contact number: [phone]  Provider they see: [place]  Reason for call: Mom call to speak with Tammy and would not leave a mess.      PRESCRIPTION REFILL ONLY  Name of prescription:  Pharmacy:

## 2016-12-22 NOTE — Telephone Encounter (Signed)
Mom called me back and said that Eric Zuniga needs a generic Adderall Rx faxed and mailed to [facility]. I told her that I would do that, but also told her that Eric Zuniga's HIPPA form that allows [place] to talk with her needs to be updated. I told her that I would mail a new form, and that Eric Zuniga needs to mail, email or fax it back to me. Mom agreed with this plan. TG

## 2016-12-22 NOTE — Telephone Encounter (Signed)
I called and left a message for Mom asking her to call me back. TG

## 2017-01-19 ENCOUNTER — Telehealth (INDEPENDENT_AMBULATORY_CARE_PROVIDER_SITE_OTHER): Payer: Self-pay | Admitting: Family

## 2017-01-19 DIAGNOSIS — F901 Attention-deficit hyperactivity disorder, predominantly hyperactive type: Secondary | ICD-10-CM

## 2017-01-19 MED ORDER — AMPHETAMINE-DEXTROAMPHETAMINE 30 MG PO TABS: ORAL_TABLET | ORAL | 0 refills | Status: DC

## 2017-01-19 NOTE — Telephone Encounter (Signed)
°  Who's calling (name and relationship to patient) : [name] (mom) Best contact number: [phone] Provider they see: Cloretta Ned Reason for call: Mom called for a refill of medication. Mom stated to it fax to pharmacy then hard copy sent in the mail to her address    [place]  Name of prescription: Addrall 30mg   Pharmacy:  [place]

## 2017-01-19 NOTE — Telephone Encounter (Signed)
Rx has been faxed to [facility] and mailed to the home address as requested

## 2017-02-21 ENCOUNTER — Telehealth (INDEPENDENT_AMBULATORY_CARE_PROVIDER_SITE_OTHER): Payer: Self-pay | Admitting: Family

## 2017-02-21 DIAGNOSIS — F901 Attention-deficit hyperactivity disorder, predominantly hyperactive type: Secondary | ICD-10-CM

## 2017-02-21 NOTE — Telephone Encounter (Signed)
  Who's calling (name and relationship to patient) : Suzi Roots, mother  Best contact number: [phone]  Provider they see: Willadean Carol  Reason for call: Mother called in requesting Adderall refill.  She requested the prescription to be faxed AND mailed to [facility] in [place].  Please notify mother at [phone] when completed.     PRESCRIPTION REFILL ONLY  Name of prescription: Adderall  Pharmacy: [facility](confirmed with mother)

## 2017-02-22 MED ORDER — AMPHETAMINE-DEXTROAMPHETAMINE 30 MG PO TABS: ORAL_TABLET | ORAL | 0 refills | Status: DC

## 2017-02-22 NOTE — Telephone Encounter (Signed)
I called mother to let her know that we had done she asked and asked her to have Eric Zuniga call for the prescriptions.  I told her that we would not be able to speak with her unless she and he came to the office and a formal HIPPA release of information was created.

## 2017-02-22 NOTE — Telephone Encounter (Signed)
Rx has been faxed to the pharmacy and has been placed up front to mailed to the pharmacy as requested by mom

## 2017-03-20 ENCOUNTER — Other Ambulatory Visit (INDEPENDENT_AMBULATORY_CARE_PROVIDER_SITE_OTHER): Payer: Self-pay | Admitting: Family

## 2017-03-20 DIAGNOSIS — F901 Attention-deficit hyperactivity disorder, predominantly hyperactive type: Secondary | ICD-10-CM

## 2017-03-20 MED ORDER — AMPHETAMINE-DEXTROAMPHETAMINE 30 MG PO TABS: ORAL_TABLET | ORAL | 0 refills | Status: DC

## 2017-03-20 NOTE — Telephone Encounter (Signed)
°  Who's calling (name and relationship to patient) : [name] (mom) Best contact number: [phone] Provider they see:  Cloretta Ned  Reason for call: Mom requesting refill of medicaton.  Mail the original to [facility]. Fax to the pharmacy.    PRESCRIPTION REFILL ONLY  Name of prescription: ADDERALL   Pharmacy:

## 2017-03-20 NOTE — Telephone Encounter (Signed)
Rx has been printed and placed on Dr. Hickling's desk 

## 2017-04-24 ENCOUNTER — Telehealth (INDEPENDENT_AMBULATORY_CARE_PROVIDER_SITE_OTHER): Payer: Self-pay | Admitting: Family

## 2017-04-24 DIAGNOSIS — F901 Attention-deficit hyperactivity disorder, predominantly hyperactive type: Secondary | ICD-10-CM

## 2017-04-24 MED ORDER — AMPHETAMINE-DEXTROAMPHETAMINE 30 MG PO TABS: ORAL_TABLET | ORAL | 0 refills | Status: DC

## 2017-04-24 NOTE — Telephone Encounter (Signed)
  Who's calling (name and relationship to patient) : Suzi Roots, mother  Best contact number: [phone]  Provider they see: Rockwell Germany, FNP  Reason for call: Mother called in requesting refill on Adderall.  Please fax Rx to [facility] and also mail hard copy to pharmacy as well per mother's request.     [place]  Name of prescription: [place]: [facility](Confirmed with mother)

## 2017-04-24 NOTE — Telephone Encounter (Signed)
Rx has been printed and faxed to the pharmacy

## 2017-05-23 ENCOUNTER — Telehealth (INDEPENDENT_AMBULATORY_CARE_PROVIDER_SITE_OTHER): Payer: Self-pay | Admitting: Family

## 2017-05-23 DIAGNOSIS — F901 Attention-deficit hyperactivity disorder, predominantly hyperactive type: Secondary | ICD-10-CM

## 2017-05-23 MED ORDER — AMPHETAMINE-DEXTROAMPHETAMINE 30 MG PO TABS: ORAL_TABLET | ORAL | 0 refills | Status: DC

## 2017-05-23 NOTE — Telephone Encounter (Signed)
°  Who's calling (name and relationship to patient) : [name] (mom) Best contact number: [phone] Provider they see: Cloretta Ned Reason for call: Mom called for a refill of Rx.  Fax to [employer] and also [employer]  to pharmacy.    PRESCRIPTION REFILL ONLY  Name of prescription: ADDERALL  Pharmacy:

## 2017-05-23 NOTE — Telephone Encounter (Signed)
Rx has been printed and placed on Dr. Hickling's desk 

## 2017-05-23 NOTE — Telephone Encounter (Signed)
Prescription signed and returned to [employer].

## 2017-06-26 ENCOUNTER — Telehealth: Payer: Self-pay | Admitting: Family

## 2017-06-26 DIAGNOSIS — F901 Attention-deficit hyperactivity disorder, predominantly hyperactive type: Secondary | ICD-10-CM

## 2017-06-26 MED ORDER — AMPHETAMINE-DEXTROAMPHETAMINE 30 MG PO TABS: ORAL_TABLET | ORAL | 0 refills | Status: DC

## 2017-06-26 NOTE — Telephone Encounter (Signed)
Rx has been printed and placed on tina's desk 

## 2017-06-26 NOTE — Telephone Encounter (Signed)
°  Who's calling (name and relationship to patient) : Mom/[name] Best contact number: [phone] Provider they see: Neal Dy NP Reason for call: Mom stated that pt needs refill, RX needs to be faxed & mailed to Pharmacy. Once confirmed please contact Mom to inform her.     PRESCRIPTION REFILL ONLY  Name of prescription: (ADDERALL) 30 MG tablet  Pharmacy: [place], [address]

## 2017-06-28 NOTE — Telephone Encounter (Signed)
Per Otila Kluver Rx was faxed and confirmed by pharmacy on 06/26/2017

## 2017-07-11 ENCOUNTER — Telehealth (INDEPENDENT_AMBULATORY_CARE_PROVIDER_SITE_OTHER): Payer: Self-pay | Admitting: Family

## 2017-07-11 NOTE — Telephone Encounter (Signed)
Eric Zuniga, how can we handle this? I know I mailed it

## 2017-07-11 NOTE — Telephone Encounter (Signed)
Mom called in just now stating that RX for November has not been mailed yet which is the one the pharmacy needs please. Mom is requesting a call back ASAP! Mom/[name] [phone]

## 2017-07-11 NOTE — Telephone Encounter (Signed)
°  Who's calling (name and relationship to patient) : Vivien Rota @ [facility] contact number: [phone] Provider they see: Gaynell Face Reason for call: Pharmacist stated that they have not received hard copy, cannot provide pt with RX until received     PRESCRIPTION REFILL ONLY  Name of prescription: adderall   Pharmacy: [facility] pharmacy

## 2017-07-11 NOTE — Telephone Encounter (Signed)
Called mom and let her know that we did resend the rx to the pharmacy

## 2017-07-11 NOTE — Telephone Encounter (Signed)
I found the original in the October Rx file and gave it to Tiffanie. TG

## 2017-07-25 ENCOUNTER — Other Ambulatory Visit (INDEPENDENT_AMBULATORY_CARE_PROVIDER_SITE_OTHER): Payer: Self-pay | Admitting: Family

## 2017-07-25 DIAGNOSIS — F901 Attention-deficit hyperactivity disorder, predominantly hyperactive type: Secondary | ICD-10-CM

## 2017-07-25 MED ORDER — AMPHETAMINE-DEXTROAMPHETAMINE 30 MG PO TABS: ORAL_TABLET | ORAL | 0 refills | Status: DC

## 2017-07-25 NOTE — Telephone Encounter (Signed)
°  Who's calling (name and relationship to patient) : [name] (mom) Best contact number: [phone] Provider they see: Cloretta Ned  Reason for call: Mom called for refill of Adderall, Otila Kluver to fax to [facility] and mail the hard copies to [place].  Please call.     PRESCRIPTION REFILL ONLY  Name of prescription: Adderall   Pharmacy:

## 2017-07-25 NOTE — Telephone Encounter (Signed)
Returned mom's call to inform her that the rx has been faxed and placed up front to be mailed

## 2017-08-22 ENCOUNTER — Telehealth (INDEPENDENT_AMBULATORY_CARE_PROVIDER_SITE_OTHER): Payer: Self-pay | Admitting: Family

## 2017-08-22 DIAGNOSIS — F901 Attention-deficit hyperactivity disorder, predominantly hyperactive type: Secondary | ICD-10-CM

## 2017-08-22 MED ORDER — AMPHETAMINE-DEXTROAMPHETAMINE 30 MG PO TABS: ORAL_TABLET | ORAL | 0 refills | Status: DC

## 2017-08-22 NOTE — Telephone Encounter (Signed)
Rx has been faxed to the pharmacy and placed upfront to be mailed to the pharmacy

## 2017-08-22 NOTE — Telephone Encounter (Signed)
Rx has been printed and placed on Dr. Hickling's desk 

## 2017-08-22 NOTE — Telephone Encounter (Signed)
°  Who's calling (name and relationship to patient) : Suzi Roots (mother) Best contact number: [phone] Provider they see: Rockwell Germany Reason for call: Pt needs refill on Adderall. Rx needs to be faxed today. Hard copy of rx needs to be mailed.   Prescription: Adderall  Pharmacy: [place], [address].

## 2017-09-24 ENCOUNTER — Telehealth (INDEPENDENT_AMBULATORY_CARE_PROVIDER_SITE_OTHER): Payer: Self-pay | Admitting: Family

## 2017-09-24 DIAGNOSIS — F901 Attention-deficit hyperactivity disorder, predominantly hyperactive type: Secondary | ICD-10-CM

## 2017-09-24 MED ORDER — AMPHETAMINE-DEXTROAMPHETAMINE 30 MG PO TABS: ORAL_TABLET | ORAL | 0 refills | Status: DC

## 2017-09-24 NOTE — Telephone Encounter (Signed)
Rx has been printed and placed on Dr. Gennaro Africa desk

## 2017-09-24 NOTE — Telephone Encounter (Signed)
Rx has been placed up front to be mailed to [place] as requested

## 2017-09-24 NOTE — Telephone Encounter (Signed)
Who's calling (name and relationship to patient) : Suzi Roots (mother) Best contact number: [phone] Provider they see: Gaynell Face Reason for call: Mom called for refill of medication.  It has to be mailed to the pharmacy and not faxed.  See below     [place]  Name of prescription: Adderall  Pharmacy: [facility] [place] [id number]  ATTEN: Kennyth Lose

## 2017-10-02 ENCOUNTER — Ambulatory Visit (INDEPENDENT_AMBULATORY_CARE_PROVIDER_SITE_OTHER): Payer: 59 | Admitting: Family

## 2017-10-05 ENCOUNTER — Ambulatory Visit (INDEPENDENT_AMBULATORY_CARE_PROVIDER_SITE_OTHER): Payer: Commercial Managed Care - PPO | Admitting: Family

## 2017-10-05 ENCOUNTER — Encounter (INDEPENDENT_AMBULATORY_CARE_PROVIDER_SITE_OTHER): Payer: Self-pay | Admitting: Family

## 2017-10-05 VITALS — BP 120/80 | HR 76 | Ht 65.0 in | Wt 143.2 lb

## 2017-10-05 DIAGNOSIS — G43009 Migraine without aura, not intractable, without status migrainosus: Secondary | ICD-10-CM

## 2017-10-05 DIAGNOSIS — F988 Other specified behavioral and emotional disorders with onset usually occurring in childhood and adolescence: Secondary | ICD-10-CM

## 2017-10-05 NOTE — Progress Notes (Signed)
Patient: Eric Zuniga MRN: [id number] Sex: male DOB: Mar 15, 1981  Provider: Rockwell Germany, NP Location of Care: [facility]  Note type: Routine return visit  History of Present Illness: Referral Source: Amaryllis Dyke, MD History from: patient and [facility] chart Chief Complaint: ADD/ADHD/Migraines  Eric Zuniga is a 37 y.o. man with history of attention deficit disorder, inattentive type and migraine headaches. He was last seen September 29, 2016. Eric is taking and tolerating generic Adderall IR, which works well to focus his attention. Eric works as a Hotel manager for [employer] and says that the medication works well for his schedule, which can vary. He tries to avoid taking the medication on weekends that he does not have to work. Without the medication, Eric says that he is impulsive, lacks focus and makes errors that he normally would not do. He recently went on a ski trip to [place] and said that he found that he had to take the medication while traveling in order to stay focused, make travel connections and follow instructions.   Eric has intermittent migraine headaches that typically occur when he is tired. He says that they resolve quickly with Tylenol and a short nap. He has not missed work due to migraine headaches.   Eric has been otherwise healthy since he was last seen. He has no other health complains today other than previously mentioned.   Review of Systems: Please see the HPI for neurologic and other pertinent review of systems. Otherwise, all other systems were reviewed and were negative.    Past Medical History:  Diagnosis Date  . Headache(784.0)    Hospitalizations: No., Head Injury: No., Nervous System Infections: No., Immunizations up to date: Yes.   Past Medical History Comments: Patient suffered a head injury at the age of 50 due to a fall. He had an injury to his thumb 2014 while using a wood splitter that  required surgical repair.   Surgical History Past Surgical History:  Procedure Laterality Date  . REPLANTATION THUMB Left Aug. 2014    Family History family history includes Alzheimer's disease in his paternal grandmother; Heart attack in his paternal grandfather. Family History is otherwise negative for migraines, seizures, cognitive impairment, blindness, deafness, birth defects, chromosomal disorder, autism.  Social History Social History   Socioeconomic History  . Marital status: Single    Spouse name: None  . Number of children: None  . Years of education: None  . Highest education level: None  Social Needs  . Financial resource strain: None  . Food insecurity - worry: None  . Food insecurity - inability: None  . Transportation needs - medical: None  . Transportation needs - non-medical: None  Occupational History  . None  Tobacco Use  . Smoking status: Never Smoker  . Smokeless tobacco: Never Used  Substance and Sexual Activity  . Alcohol use: No    Alcohol/week: 0.0 oz  . Drug use: No  . Sexual activity: Yes  Other Topics Concern  . 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 He enjoys skiing, soccer, and golf.     Allergies No Known Allergies  Physical Exam BP 120/80   Pulse 76   Ht 5\' 5"  (1.651 m)   Wt 143 lb 3.2 oz (65 kg)   BMI 23.83 kg/m  General: Well developed, well nourished, seated, in no evident distress, brown hair, brown eyes, right handed Head: Head normocephalic and  atraumatic.  Oropharynx benign. Neck: Supple with no carotid bruits Cardiovascular: Regular rate and rhythm, no murmurs Respiratory: Breath sounds clear to auscultation Musculoskeletal: No obvious deformities or scoliosis Skin: No rashes or neurocutaneous lesions  Neurologic Exam Mental Status: Awake and fully alert.  Oriented to place and time.  Recent and remote memory intact.  Attention span, concentration, and  fund of knowledge appropriate.  Mood and affect appropriate. Cranial Nerves: Fundoscopic exam reveals sharp disc margins.  Pupils equal, briskly reactive to light.  Extraocular movements full without nystagmus.  Visual fields full to confrontation.  Hearing intact and symmetric to finger rub.  Facial sensation intact.  Face tongue, palate move normally and symmetrically.  Neck flexion and extension normal. Motor: Normal bulk and tone. Normal strength in all tested extremity muscles. Sensory: Intact to touch and temperature in all extremities.  Coordination: Rapid alternating movements normal in all extremities.  Finger-to-nose and heel-to shin performed accurately bilaterally.  Romberg negative. Gait and Station: Arises from chair without difficulty.  Stance is normal. Gait demonstrates normal stride length and balance.   Able to heel, toe and tandem walk without difficulty. Reflexes: 1+ and symmetric. Toes downgoing.  Impression 1. Attention deficit disorder, predominately inattentive type 2. Migraine without aura  Recommendations for plan of care The patient's previous [facility] records were reviewed. Eric has neither had nor required imaging or lab studies since the last visit. He is a 37 year old young man with attention deficit disorder, inattentive type and migraine without aura. He is taking and tolerating generic Adderall for his problems with attention. He is satisfied with the current treatment plan and says that it works well to focus his attention for the work day. Romone's migraines are infrequent and not problematic to him at this time.   I will see Eric back in follow up in 1 year or sooner if needed. He agreed with the plans made today.   The medication list was reviewed and reconciled.  No changes were made in the prescribed medications today.  A complete medication list was provided to the patient.   Allergies as of 10/05/2017   No Known Allergies     Medication List         Accurate as of 10/05/17 11:58 AM. Always use your most recent med list.          amphetamine-dextroamphetamine 30 MG tablet Commonly known as:  ADDERALL Take 1 tab by mouth in the morning and 1/2 tab by mouth in the afternoon       Total time spent with the patient was 20 minutes, of which 50% or more was spent in counseling and coordination of care.   Rockwell Germany NP-C

## 2017-10-08 ENCOUNTER — Encounter (INDEPENDENT_AMBULATORY_CARE_PROVIDER_SITE_OTHER): Payer: Self-pay | Admitting: Family

## 2017-10-08 NOTE — Patient Instructions (Signed)
Thank you for coming in today.   Instructions for you until your next appointment are as follows: 1. Continue taking the generic Adderall as you have been taking it.  2. Let me know if your headaches become more frequent or more severe.  3. Please sign up for MyChart if you have not done so 4. Please plan to return for follow up in one year or sooner if needed.

## 2017-10-22 ENCOUNTER — Telehealth (INDEPENDENT_AMBULATORY_CARE_PROVIDER_SITE_OTHER): Payer: Self-pay | Admitting: Family

## 2017-10-22 DIAGNOSIS — F901 Attention-deficit hyperactivity disorder, predominantly hyperactive type: Secondary | ICD-10-CM

## 2017-10-22 MED ORDER — AMPHETAMINE-DEXTROAMPHETAMINE 30 MG PO TABS: ORAL_TABLET | ORAL | 0 refills | Status: DC

## 2017-10-22 NOTE — Telephone Encounter (Signed)
I spoke to [place] and for the time being we are not going to change the work flow because apparently the system has not been widely rolled out and on occasion the signal does not go through.  We will send electronic prescriptions on request from parents.

## 2017-10-22 NOTE — Telephone Encounter (Signed)
I took care of the prescription and send it electronically.  I also called mother to let her know that had been done.  This is something that all of our providers can do.  I spoke with Tiffany to inform her.  This will require a change in work flow.

## 2017-10-22 NOTE — Telephone Encounter (Signed)
Rx has been printed and placed on Dr. Melanee Left desk. Mother expressed her frustrations on how we have not switched over o have the controlled substances sent electronically. She would like to know when this is going to happen. Please advise

## 2017-10-22 NOTE — Telephone Encounter (Signed)
°  Who's calling (name and relationship to patient) : [name] (mother)  Best contact number:[phone]  Provider they see: Rockwell Germany  Reason for call: Patients mother called stating that patient only has enough medication to last him for today. Mom also wanting to know when the pharmacy will be able to request refills for this medication electronically.     PRESCRIPTION REFILL ONLY  Name of prescription: Adderall  [facility]: [facility] (Please remove [place] per the patients mothers request)

## 2017-10-22 NOTE — Telephone Encounter (Signed)
Who's calling (name and relationship to patient) : [name] (mother)  Best contact [phone]  Provider they see: Rockwell Germany  Reason for call: Patients mother called stating that patient only has enough medication to last him for today. Mom also wanting to know when the pharmacy will be able to request refills for this medication electronically.     PRESCRIPTION REFILL ONLY  Name of prescription: Adderall  Pharmacy: [facility] (Please [address] per the patients mothers request)

## 2017-10-23 ENCOUNTER — Other Ambulatory Visit (INDEPENDENT_AMBULATORY_CARE_PROVIDER_SITE_OTHER): Payer: Self-pay | Admitting: Pediatrics

## 2017-10-23 DIAGNOSIS — F901 Attention-deficit hyperactivity disorder, predominantly hyperactive type: Secondary | ICD-10-CM

## 2017-10-23 MED ORDER — AMPHETAMINE-DEXTROAMPHETAMINE 30 MG PO TABS: ORAL_TABLET | ORAL | 0 refills | Status: DC

## 2017-10-23 NOTE — Addendum Note (Signed)
Addended by: Jodi Geralds on: 10/23/2017 04:51 PM   Modules accepted: Orders

## 2017-10-23 NOTE — Telephone Encounter (Signed)
Informed mom that rx was sent in for next month as well

## 2017-10-23 NOTE — Telephone Encounter (Signed)
Dr. Gaynell Face do you want to fill this?

## 2017-10-23 NOTE — Telephone Encounter (Signed)
Please let Martinique know that we have sent it to the pharmacy electronically.

## 2017-11-19 ENCOUNTER — Other Ambulatory Visit (INDEPENDENT_AMBULATORY_CARE_PROVIDER_SITE_OTHER): Payer: Self-pay | Admitting: Pediatrics

## 2017-11-19 DIAGNOSIS — F901 Attention-deficit hyperactivity disorder, predominantly hyperactive type: Secondary | ICD-10-CM

## 2017-11-19 NOTE — Telephone Encounter (Signed)
Please send in the medication

## 2018-01-23 ENCOUNTER — Other Ambulatory Visit (INDEPENDENT_AMBULATORY_CARE_PROVIDER_SITE_OTHER): Payer: Self-pay | Admitting: Pediatrics

## 2018-01-23 DIAGNOSIS — F901 Attention-deficit hyperactivity disorder, predominantly hyperactive type: Secondary | ICD-10-CM

## 2018-01-24 NOTE — Telephone Encounter (Signed)
Please send this to the pharmacy

## 2018-01-25 ENCOUNTER — Other Ambulatory Visit (INDEPENDENT_AMBULATORY_CARE_PROVIDER_SITE_OTHER): Payer: Self-pay | Admitting: Pediatrics

## 2018-01-25 DIAGNOSIS — F901 Attention-deficit hyperactivity disorder, predominantly hyperactive type: Secondary | ICD-10-CM

## 2018-01-28 NOTE — Telephone Encounter (Signed)
Please send to pharmacy.

## 2018-02-25 ENCOUNTER — Other Ambulatory Visit (INDEPENDENT_AMBULATORY_CARE_PROVIDER_SITE_OTHER): Payer: Self-pay | Admitting: Pediatrics

## 2018-02-25 DIAGNOSIS — F901 Attention-deficit hyperactivity disorder, predominantly hyperactive type: Secondary | ICD-10-CM

## 2018-02-25 NOTE — Telephone Encounter (Signed)
Will you send this to the pharmacy

## 2018-03-25 ENCOUNTER — Other Ambulatory Visit (INDEPENDENT_AMBULATORY_CARE_PROVIDER_SITE_OTHER): Payer: Self-pay | Admitting: Family

## 2018-03-25 DIAGNOSIS — F901 Attention-deficit hyperactivity disorder, predominantly hyperactive type: Secondary | ICD-10-CM

## 2018-03-25 NOTE — Telephone Encounter (Signed)
Will you please send this to the pharmacy

## 2018-04-23 ENCOUNTER — Other Ambulatory Visit (INDEPENDENT_AMBULATORY_CARE_PROVIDER_SITE_OTHER): Payer: Self-pay | Admitting: Family

## 2018-04-23 DIAGNOSIS — F901 Attention-deficit hyperactivity disorder, predominantly hyperactive type: Secondary | ICD-10-CM

## 2018-04-24 NOTE — Telephone Encounter (Signed)
Please send refill to pharmacy

## 2018-06-25 ENCOUNTER — Other Ambulatory Visit (INDEPENDENT_AMBULATORY_CARE_PROVIDER_SITE_OTHER): Payer: Self-pay | Admitting: Family

## 2018-06-25 DIAGNOSIS — F901 Attention-deficit hyperactivity disorder, predominantly hyperactive type: Secondary | ICD-10-CM

## 2018-06-26 NOTE — Telephone Encounter (Signed)
Please send refill to the pharmacy

## 2018-07-22 ENCOUNTER — Other Ambulatory Visit (INDEPENDENT_AMBULATORY_CARE_PROVIDER_SITE_OTHER): Payer: Self-pay | Admitting: Family

## 2018-07-22 DIAGNOSIS — F901 Attention-deficit hyperactivity disorder, predominantly hyperactive type: Secondary | ICD-10-CM

## 2018-07-22 NOTE — Telephone Encounter (Signed)
Please send to pharmacy.

## 2018-07-22 NOTE — Telephone Encounter (Signed)
°  Who's calling (name and relationship to patient) : [name] (mom0 Best contact number: [phone] Provider they see: [facility]  Reason for call: Mom called for Rx need sent to pharmacy. Mom stated patient has only 2 pill left.      PRESCRIPTION REFILL ONLY  Name of prescription:Addrall   Pharmacy:

## 2018-08-05 ENCOUNTER — Other Ambulatory Visit (INDEPENDENT_AMBULATORY_CARE_PROVIDER_SITE_OTHER): Payer: Self-pay | Admitting: Family

## 2018-08-05 DIAGNOSIS — F901 Attention-deficit hyperactivity disorder, predominantly hyperactive type: Secondary | ICD-10-CM

## 2018-08-05 NOTE — Telephone Encounter (Signed)
Please send to pharmacy.

## 2018-08-26 ENCOUNTER — Telehealth (INDEPENDENT_AMBULATORY_CARE_PROVIDER_SITE_OTHER): Payer: Self-pay | Admitting: Family

## 2018-08-26 DIAGNOSIS — F901 Attention-deficit hyperactivity disorder, predominantly hyperactive type: Secondary | ICD-10-CM

## 2018-08-26 MED ORDER — AMPHETAMINE-DEXTROAMPHETAMINE 30 MG PO TABS: ORAL_TABLET | ORAL | 0 refills | Status: DC

## 2018-08-26 NOTE — Telephone Encounter (Signed)
Please send the prescription to the pharmacy

## 2018-08-26 NOTE — Telephone Encounter (Signed)
Sent electronically TG 

## 2018-08-26 NOTE — Telephone Encounter (Signed)
°  Who's calling (name and relationship to patient) : Eric Zuniga (mom) Best contact number: [phone] Provider they see: Cloretta Ned  Reason for call: Need patient medication refill     PRESCRIPTION REFILL ONLY  Name of [name] [address]

## 2018-09-19 ENCOUNTER — Other Ambulatory Visit (INDEPENDENT_AMBULATORY_CARE_PROVIDER_SITE_OTHER): Payer: Self-pay | Admitting: Family

## 2018-09-19 DIAGNOSIS — F901 Attention-deficit hyperactivity disorder, predominantly hyperactive type: Secondary | ICD-10-CM

## 2018-09-19 MED ORDER — AMPHETAMINE-DEXTROAMPHETAMINE 30 MG PO TABS: ORAL_TABLET | ORAL | 0 refills | Status: DC

## 2018-10-21 ENCOUNTER — Other Ambulatory Visit (INDEPENDENT_AMBULATORY_CARE_PROVIDER_SITE_OTHER): Payer: Self-pay | Admitting: Family

## 2018-10-21 DIAGNOSIS — F901 Attention-deficit hyperactivity disorder, predominantly hyperactive type: Secondary | ICD-10-CM

## 2018-10-21 NOTE — Telephone Encounter (Signed)
Please send to the pharmacy °

## 2018-11-18 ENCOUNTER — Other Ambulatory Visit (INDEPENDENT_AMBULATORY_CARE_PROVIDER_SITE_OTHER): Payer: Self-pay | Admitting: Family

## 2018-11-18 DIAGNOSIS — F901 Attention-deficit hyperactivity disorder, predominantly hyperactive type: Secondary | ICD-10-CM

## 2018-11-19 NOTE — Telephone Encounter (Signed)
Please send to the pharmacy °

## 2018-12-09 ENCOUNTER — Telehealth (INDEPENDENT_AMBULATORY_CARE_PROVIDER_SITE_OTHER): Payer: Self-pay | Admitting: Family

## 2018-12-09 NOTE — Telephone Encounter (Signed)
°  Who's calling (name and relationship to patient) : Yambao,[name] Best contact number: [phone] Provider they see: [facility] Reason for call: Mom feels that Martinique needs to have his height and weight checked yearly in office and a Web visit is not appropriate.   She states Holten's BP was abnormal once but feels this was due to stress.  Martinique does not want to come in office due to COVID-19 concerns.  Mom request that Otila Kluver refill Ricco's Adderall until COVID-19 has subsided.  I canceled the appt on 4/15 at 10:30, mom states that time would not work for [place]ique at all. I explained to mom the purpose of the Web appt and that Otila Kluver could answer any questions Martinique may have and would be able to refill his medications.  Mom stated Martinique has been on the same dose of Adderall for 20 years so Otila Kluver should be fine just calling it in. Please call mom.      PRESCRIPTION REFILL ONLY  Name of prescription:  Pharmacy:

## 2018-12-09 NOTE — Telephone Encounter (Signed)
I wrote a letter to Eric Zuniga explaining that he was last seen October 05, 2017 and that I need to see him either in person or by Webex by end of May 2020 in order to continue to prescribe the Adderall for him. TG

## 2018-12-09 NOTE — Telephone Encounter (Signed)
I am in complete agreement with your letter.  He needs to be seen once a year.  Many places would make him come more often.  If this is not something that he can manage or he chooses not to do so, he will have to find another provider.

## 2018-12-11 ENCOUNTER — Ambulatory Visit (INDEPENDENT_AMBULATORY_CARE_PROVIDER_SITE_OTHER): Payer: Commercial Managed Care - PPO | Admitting: Family

## 2018-12-11 ENCOUNTER — Telehealth (INDEPENDENT_AMBULATORY_CARE_PROVIDER_SITE_OTHER): Payer: Self-pay | Admitting: Family

## 2018-12-11 NOTE — Telephone Encounter (Signed)
I called and talked to Eric Zuniga. 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. I explained that he hasn't been seen since Feb 2019 and that he needs a visit or phone/webex visit in order for me to continue to prescribe the medication. I told Eric Zuniga that I had mailed Eric Zuniga a letter about the webex process and she said that he doesn't have access to internet during the day because he is on the road all day. She accepted a phone appointment for him tomorrow morning at 8AM so he can do that before he gets into stores etc. TG

## 2018-12-11 NOTE — Telephone Encounter (Signed)
°  Who's calling (name and relationship to patient) : Venancio Poisson - Mom   Best contact number: [phone]  Provider they see: Rockwell Germany   Reason for call: Mom called back again about the previous call she had made about the Webex and OV. She is needing a call back from [place] about the Adderall and and appointment. Please advise     PRESCRIPTION REFILL ONLY  Name of prescription:  Pharmacy:

## 2018-12-12 ENCOUNTER — Ambulatory Visit (INDEPENDENT_AMBULATORY_CARE_PROVIDER_SITE_OTHER): Payer: Commercial Managed Care - PPO | Admitting: Family

## 2018-12-12 ENCOUNTER — Encounter (INDEPENDENT_AMBULATORY_CARE_PROVIDER_SITE_OTHER): Payer: Self-pay | Admitting: Family

## 2018-12-12 ENCOUNTER — Other Ambulatory Visit: Payer: Self-pay

## 2018-12-12 DIAGNOSIS — F988 Other specified behavioral and emotional disorders with onset usually occurring in childhood and adolescence: Secondary | ICD-10-CM

## 2018-12-12 DIAGNOSIS — G43009 Migraine without aura, not intractable, without status migrainosus: Secondary | ICD-10-CM

## 2018-12-12 NOTE — Patient Instructions (Signed)
Thank you for meeting with me by phone today.   Instructions for you until your next appointment are as follows: 1. Continue taking the generic Adderall as you have been doing 2. Remember to try to healthy meals during your work day when possible. 3. Continue to check your weight periodically. Your last weight here in 2019 was 143 lbs 4. Try to have your blood pressure checked sometime 5. Continue exercising most days of the week as you have been doing 6. Please sign up for MyChart if you have not done so 7. Please plan to return for follow up in one year or sooner if needed.

## 2018-12-12 NOTE — Progress Notes (Signed)
This is a Pediatric Specialist E-Visit follow up consult provided via Telephone Eric Zuniga consented to an E-Visit consult today.  Location of patient: Eric is at home Location of provider: Rockwell Germany, NP-C is in office Patient was referred by Amaryllis Dyke, MD   The following participants were involved in this E-Visit: patient, CMA, provider  Chief Complain/ Reason for E-Visit today: ADD/ADHD/Migraines Total time on call: 6 min Follow up: 1 year     Eric Zuniga   MRN:  [id number]  [birth year]   Provider: Rockwell Germany NP-C Location of Care: [facility]  Visit type: Routine Visit  Last visit: 10/05/2017  Referral source: Amaryllis Dyke, MD History from: patient and [facility] chart  Brief history:  History of attention deficit disorder, inattentive type and migraine headaches. He is taking and tolerating generic Adderall IR, which has worked well for many years to focus his attention. He has intermittent migraines, which requires medication and sleep to obtain relief.  Today's concerns:  Eric reports today that the generic Adderall IR continues to work well to focus his attention for his work days. He does not take the medication on weekends unless he has to work. Without medication, Eric reports being impulsive, lacks focus and makes errors in his work. Eric says that he has been running for exercise since gyms are closed due to Covid 19 pandemic restrictions. He eats breakfast at home and eats lunch while on the road with his work. He says that he generally does not skip meals but that sometimes when he is very busy he forgets to eat lunch on time and ends up snacking. He checks his weight once per week and says that it is unchanged from the last visit. He has not checked his blood pressure recently but says that he will do so soon. Eric says that he has no trouble going to sleep at night and usually sleeps well. Eric says that he  has had some tension headaches but no recent migraines. He has been otherwise generally healthy since he was last seen and has no other health concerns today other than previously mentioned.   Review of systems: Please see HPI for neurologic and other pertinent review of systems. Otherwise all other systems were reviewed and were negative.  Problem List: Patient Active Problem List   Diagnosis Date Noted  . Attention deficit disorder predominant inattentive type 08/25/2014  . Attention deficit disorder with hyperactivity(314.01) 07/18/2013  . Migraine without aura 07/18/2013   Past Medical History:  Diagnosis Date  . Headache(784.0)     Past medical history comments: See HPI Copied from previous record: Patient suffered a head injury at the age of 66 due to a fall. He had an injury to his thumb 2014 while using a wood splitter that required surgical repair.  Surgical history: Past Surgical History:  Procedure Laterality Date  . REPLANTATION THUMB Left Aug. 2014    Family history: family history includes Alzheimer's disease in his paternal grandmother; Heart attack in his paternal grandfather.   Social history: Social History   Socioeconomic History  . Marital status: Single    Spouse name: Not on file  . Number of children: Not on file  . Years of education: Not on file  . Highest education level: Not on file  Occupational History  . Not on file  Social Needs  . Financial resource strain: Not on file  . Food insecurity:    Worry: Not on file  Inability: Not on file  . Transportation needs:    Medical: Not on file    Non-medical: Not on file  Tobacco Use  . Smoking status: Never Smoker  . Smokeless tobacco: Never Used  Substance and Sexual Activity  . Alcohol use: No    Alcohol/week: 0.0 standard drinks  . Drug use: No  . Sexual activity: Yes  Lifestyle  . Physical activity:    Days per week: Not on file    Minutes per session: Not on file  . Stress: Not on  file  Relationships  . Social connections:    Talks on phone: Not on file    Gets together: Not on file    Attends religious service: Not on file    Active member of club or organization: Not on file    Attends meetings of clubs or organizations: Not on file    Relationship status: Not on file  . Intimate partner violence:    Fear of current or ex partner: Not on file    Emotionally abused: Not on file    Physically abused: Not on file    Forced sexual activity: Not on file  Other Topics Concern  . 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    Allergies: No Known Allergies   Immunizations:  There is no immunization history on file for this patient.   Impression: 1. Attention deficit disorder, inattentive type 2. Migraine without aura  Recommendations for plan of care: The patient's previus [facility] records were reviewed. Eric has neither had nor required imaging or lab studies since the last visit. He is a 38 year old man with history of attention deficit disorder, in attentive type and migraine without aura. He is taking and tolerating generic Adderall IR, which has worked well for him for many years. He will continue his medication without change. I reminded Eric about not skipping meals and drinking plenty of water each day. I asked him to let me know if his headaches become more frequent or more severe. I will see him back in follow up in 1 year or sooner if needed.   The medication list was reviewed and reconciled. No changes were made in the prescribed medications today. A complete medication list was provided to the patient.  Allergies as of 12/12/2018   No Known Allergies     Medication List       Accurate as of December 12, 2018  8:05 AM. Always use your most recent med list.        amphetamine-dextroamphetamine 30 MG tablet Commonly known as:  ADDERALL TAKE ONE  TABLET IN THE MORNING AND 1/2 TABLET IN THE AFTERNOON        Total time spent on the phone with the patient was 6 minutes, of which 50% or more was spent in counseling and coordination of care.  Rockwell Germany NP-C [facility] Ph. [phone] Fax [phone]

## 2018-12-14 ENCOUNTER — Encounter (INDEPENDENT_AMBULATORY_CARE_PROVIDER_SITE_OTHER): Payer: Self-pay | Admitting: Family

## 2019-01-22 ENCOUNTER — Other Ambulatory Visit (INDEPENDENT_AMBULATORY_CARE_PROVIDER_SITE_OTHER): Payer: Self-pay | Admitting: Family

## 2019-01-22 DIAGNOSIS — F901 Attention-deficit hyperactivity disorder, predominantly hyperactive type: Secondary | ICD-10-CM

## 2019-01-22 NOTE — Telephone Encounter (Signed)
Please send to pharmacy.

## 2019-02-24 ENCOUNTER — Other Ambulatory Visit (INDEPENDENT_AMBULATORY_CARE_PROVIDER_SITE_OTHER): Payer: Self-pay | Admitting: Family

## 2019-02-24 DIAGNOSIS — F901 Attention-deficit hyperactivity disorder, predominantly hyperactive type: Secondary | ICD-10-CM

## 2019-02-24 NOTE — Telephone Encounter (Signed)
Please send to the pharmacy °

## 2019-03-24 ENCOUNTER — Other Ambulatory Visit (INDEPENDENT_AMBULATORY_CARE_PROVIDER_SITE_OTHER): Payer: Self-pay | Admitting: Family

## 2019-03-24 DIAGNOSIS — F901 Attention-deficit hyperactivity disorder, predominantly hyperactive type: Secondary | ICD-10-CM

## 2019-03-24 NOTE — Telephone Encounter (Signed)
Please send to the pharmacy °

## 2019-04-21 ENCOUNTER — Other Ambulatory Visit (INDEPENDENT_AMBULATORY_CARE_PROVIDER_SITE_OTHER): Payer: Self-pay | Admitting: Family

## 2019-04-21 DIAGNOSIS — F901 Attention-deficit hyperactivity disorder, predominantly hyperactive type: Secondary | ICD-10-CM

## 2019-04-21 NOTE — Telephone Encounter (Signed)
Please send to the pharmacy °

## 2019-05-22 ENCOUNTER — Other Ambulatory Visit (INDEPENDENT_AMBULATORY_CARE_PROVIDER_SITE_OTHER): Payer: Self-pay | Admitting: Family

## 2019-05-22 DIAGNOSIS — F901 Attention-deficit hyperactivity disorder, predominantly hyperactive type: Secondary | ICD-10-CM

## 2019-05-22 NOTE — Telephone Encounter (Signed)
Please send to the pharmacy °

## 2019-06-23 ENCOUNTER — Other Ambulatory Visit (INDEPENDENT_AMBULATORY_CARE_PROVIDER_SITE_OTHER): Payer: Self-pay | Admitting: Family

## 2019-06-23 DIAGNOSIS — F901 Attention-deficit hyperactivity disorder, predominantly hyperactive type: Secondary | ICD-10-CM

## 2019-06-23 NOTE — Telephone Encounter (Signed)
Please send to the pharmacy °

## 2019-07-22 ENCOUNTER — Other Ambulatory Visit (INDEPENDENT_AMBULATORY_CARE_PROVIDER_SITE_OTHER): Payer: Self-pay | Admitting: Family

## 2019-07-22 DIAGNOSIS — F901 Attention-deficit hyperactivity disorder, predominantly hyperactive type: Secondary | ICD-10-CM

## 2019-07-22 NOTE — Telephone Encounter (Signed)
Please send to the pharmacy °

## 2019-07-25 ENCOUNTER — Other Ambulatory Visit (INDEPENDENT_AMBULATORY_CARE_PROVIDER_SITE_OTHER): Payer: Self-pay | Admitting: Pediatrics

## 2019-07-25 DIAGNOSIS — F901 Attention-deficit hyperactivity disorder, predominantly hyperactive type: Secondary | ICD-10-CM

## 2019-07-28 NOTE — Telephone Encounter (Signed)
Please send upon your discretion

## 2019-09-23 ENCOUNTER — Other Ambulatory Visit (INDEPENDENT_AMBULATORY_CARE_PROVIDER_SITE_OTHER): Payer: Self-pay | Admitting: Pediatrics

## 2019-09-23 DIAGNOSIS — F901 Attention-deficit hyperactivity disorder, predominantly hyperactive type: Secondary | ICD-10-CM

## 2019-09-24 ENCOUNTER — Other Ambulatory Visit (INDEPENDENT_AMBULATORY_CARE_PROVIDER_SITE_OTHER): Payer: Self-pay | Admitting: Family

## 2019-09-24 DIAGNOSIS — F901 Attention-deficit hyperactivity disorder, predominantly hyperactive type: Secondary | ICD-10-CM

## 2019-09-24 NOTE — Telephone Encounter (Signed)
Please send to the pharmacy °

## 2019-11-19 ENCOUNTER — Other Ambulatory Visit (INDEPENDENT_AMBULATORY_CARE_PROVIDER_SITE_OTHER): Payer: Self-pay | Admitting: Family

## 2019-11-19 ENCOUNTER — Telehealth (INDEPENDENT_AMBULATORY_CARE_PROVIDER_SITE_OTHER): Payer: Self-pay | Admitting: Family

## 2019-11-19 DIAGNOSIS — F901 Attention-deficit hyperactivity disorder, predominantly hyperactive type: Secondary | ICD-10-CM

## 2019-11-19 NOTE — Telephone Encounter (Signed)
Left message advising patient to make an appointment with Otila Kluver

## 2019-11-19 NOTE — Telephone Encounter (Signed)
-----   Message from Rockwell Germany, NP sent at 11/19/2019 12:40 PM EDT ----- Regarding: needs appt [place] needs an appointment with me in April. Virtual is fine.  Thanks, Otila Kluver

## 2019-11-19 NOTE — Telephone Encounter (Signed)
Please send to the pharmacy °

## 2019-12-24 ENCOUNTER — Other Ambulatory Visit (INDEPENDENT_AMBULATORY_CARE_PROVIDER_SITE_OTHER): Payer: Self-pay | Admitting: Family

## 2019-12-24 DIAGNOSIS — F901 Attention-deficit hyperactivity disorder, predominantly hyperactive type: Secondary | ICD-10-CM

## 2019-12-24 NOTE — Telephone Encounter (Signed)
Please send to the pharmacy °

## 2020-01-06 ENCOUNTER — Other Ambulatory Visit: Payer: Self-pay

## 2020-01-06 ENCOUNTER — Encounter (INDEPENDENT_AMBULATORY_CARE_PROVIDER_SITE_OTHER): Payer: Self-pay | Admitting: Family

## 2020-01-06 ENCOUNTER — Ambulatory Visit (INDEPENDENT_AMBULATORY_CARE_PROVIDER_SITE_OTHER): Payer: Commercial Managed Care - PPO | Admitting: Family

## 2020-01-06 VITALS — BP 98/80 | HR 72 | Ht 65.0 in | Wt 145.6 lb

## 2020-01-06 DIAGNOSIS — F988 Other specified behavioral and emotional disorders with onset usually occurring in childhood and adolescence: Secondary | ICD-10-CM

## 2020-01-06 DIAGNOSIS — G43009 Migraine without aura, not intractable, without status migrainosus: Secondary | ICD-10-CM

## 2020-01-06 NOTE — Progress Notes (Signed)
Eric Zuniga   MRN:  [id number]  10-Dec-1980   Provider: Rockwell Germany NP-C Location of Care: [facility]  Visit type: Routine visit  Last visit: 12/22/2018  Referral source: Amaryllis Dyke, MD History from: ADD/ADHD/Migraines  Brief history:  Copied from previous record: History of attention deficit disorder, inattentive type and migraine headaches. He is taking and tolerating generic Adderall IR, which has worked well for many years to focus his attention. He has intermittent migraines, which requires medication and sleep to obtain relief.  Today's concerns:  Eric reports today that the generic Adderall IR works well to focus his attention for the work day. He has more problems with distraction when working at his desk and does best when he is up and active. He has no problems with appetite or sleep.   Eric has intermittent migraines but says that they have been less frequent as time goes on. He identifies triggers of missed meals and stress.   Eric has been otherwise generally healthy since he was last seen. He has no health concerns today other than previously mentioned.   Review of systems: Please see HPI for neurologic and other pertinent review of systems. Otherwise all other systems were reviewed and were negative.  Problem List: Patient Active Problem List   Diagnosis Date Noted  . Attention deficit disorder predominant inattentive type 08/25/2014  . Attention deficit disorder with hyperactivity(314.01) 07/18/2013  . Migraine without aura 07/18/2013     Past Medical History:  Diagnosis Date  . Headache(784.0)     Past medical history comments: See HPI Copied from previous record: Patient suffered a head injury at the age of 35 due to a fall. He had an injury to his thumb 2014 while using a wood splitter that required surgical repair.  Surgical history: Past Surgical History:  Procedure Laterality Date  . REPLANTATION THUMB  Left Aug. 2014     Family history: family history includes Alzheimer's disease in his paternal grandmother; Heart attack in his paternal grandfather.   Social history: Social History   Socioeconomic History  . Marital status: Single    Spouse name: Not on file  . Number of children: Not on file  . Years of education: Not on file  . Highest education level: Not on file  Occupational History  . Not on file  Tobacco Use  . Smoking status: Never Smoker  . Smokeless tobacco: Never Used  Substance and Sexual Activity  . Alcohol use: No    Alcohol/week: 0.0 standard drinks  . Drug use: No  . Sexual activity: Yes  Other Topics Concern  . 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    Social Determinants of Health   Financial Resource Strain:   . Difficulty of Paying Living Expenses:   Food Insecurity:   . Worried About Charity fundraiser in the Last Year:   . Arboriculturist in the Last Year:   Transportation Needs:   . Film/video editor (Medical):   Marland Kitchen Lack of Transportation (Non-Medical):   Physical Activity:   . Days of Exercise per Week:   . Minutes of Exercise per Session:   Stress:   . Feeling of Stress :   Social Connections:   . Frequency of Communication with Friends and Family:   . Frequency of Social Gatherings with Friends and Family:   .  Attends Religious Services:   . Active Member of Clubs or Organizations:   . Attends Archivist Meetings:   Marland Kitchen Marital Status:   Intimate Partner Violence:   . Fear of Current or Ex-Partner:   . Emotionally Abused:   Marland Kitchen Physically Abused:   . Sexually Abused:     Past/failed meds:   Allergies: No Known Allergies    Immunizations:  There is no immunization history on file for this patient.    Diagnostics/Screenings:   Physical Exam: BP 98/80   Pulse 72   Ht 5\' 5"  (1.651 m)   Wt 145 lb 9.6 oz  (66 kg)   BMI 24.23 kg/m   General: Well developed, well nourished man, seated on exam table, in no evident distress, brown hair, brown eyes, right handed Head: Head normocephalic and atraumatic.  Oropharynx benign. Neck: Supple with no carotid bruits Cardiovascular: Regular rate and rhythm, no murmurs Respiratory: Breath sounds clear to auscultation Musculoskeletal: No obvious deformities or scoliosis Skin: No rashes or neurocutaneous lesions  Neurologic Exam Mental Status: Awake and fully alert.  Oriented to place and time.  Recent and remote memory intact.  Attention span, concentration, and fund of knowledge appropriate.  Mood and affect appropriate. Cranial Nerves: Fundoscopic exam reveals sharp disc margins.  Pupils equal, briskly reactive to light.  Extraocular movements full without nystagmus.  Visual fields full to confrontation.  Hearing intact and symmetric to finger rub.  Facial sensation intact.  Face tongue, palate move normally and symmetrically.  Neck flexion and extension normal. Motor: Normal bulk and tone. Normal strength in all tested extremity muscles. Sensory: Intact to touch and temperature in all extremities.  Coordination: Rapid alternating movements normal in all extremities.  Finger-to-nose and heel-to shin performed accurately bilaterally.  Romberg negative. Gait and Station: Arises from chair without difficulty.  Stance is normal. Gait demonstrates normal stride length and balance.   Able to heel, toe and tandem walk without difficulty. Reflexes: 1+ and symmetric. Toes downgoing.  Impression: 1. Attention deficit disorder, inattentive type 2. Migraine without aura  Recommendations for plan of care: The patient's previous [facility] records were reviewed. Eric has neither had nor required imaging or lab studies since the last visit. He is a 39 year old man with history of attention deficit disorder, inattentive type and migraine without aura. He is taking and  tolerating generic Adderall IR, which works well to focus his attention for the work day. He has intermittent migraines that are managed with medication and rest. I talked with Eric about finding an adult provider for him and he was aggreeable with that plan. I will otherwise see him back in follow up in 1 year unless I am able to transition his care to an adult provider for his next visit.   The medication list was reviewed and reconciled. No changes were made in the prescribed medications today. A complete medication list was provided to the patient.  Allergies as of 01/06/2020   No Known Allergies     Medication List       Accurate as of Jan 06, 2020  3:34 PM. If you have any questions, ask your nurse or doctor.        amphetamine-dextroamphetamine 30 MG tablet Commonly known as: ADDERALL TAKE 1 TABLET EVERY MORNING AND 1/2 TABLET EVERY AFTERNOON      Total time spent with the patient was 20 minutes, of which 50% or more was spent in counseling and coordination of care.  Rockwell Germany  NP-C [facility] Ph. [phone] Fax [phone]

## 2020-01-10 ENCOUNTER — Encounter (INDEPENDENT_AMBULATORY_CARE_PROVIDER_SITE_OTHER): Payer: Self-pay | Admitting: Family

## 2020-01-10 NOTE — Patient Instructions (Signed)
Thank you for coming in today.   Instructions for you until your next appointment are as follows: 1. Continue taking the generic Adderall as you have been doing. Let me know if you have any concerns about your medication.  2. Remember that it is important for you to drink plenty of water each day, avoid skipping meals and get at least 8 hours of sleep each night as these measures are known to help reduce headache frequency. Let me know if your headaches become more frequent or more severe.  3. Please sign up for MyChart if you have not done so 4. I will look into finding an adult neurology provider for you. Otherwise, please plan to return for follow up in one year or sooner if needed.

## 2020-01-21 ENCOUNTER — Other Ambulatory Visit (INDEPENDENT_AMBULATORY_CARE_PROVIDER_SITE_OTHER): Payer: Self-pay | Admitting: Family

## 2020-01-21 DIAGNOSIS — F901 Attention-deficit hyperactivity disorder, predominantly hyperactive type: Secondary | ICD-10-CM

## 2020-01-21 NOTE — Telephone Encounter (Signed)
Please send to the pharmacy °

## 2020-02-25 ENCOUNTER — Other Ambulatory Visit (INDEPENDENT_AMBULATORY_CARE_PROVIDER_SITE_OTHER): Payer: Self-pay | Admitting: Family

## 2020-02-25 DIAGNOSIS — F901 Attention-deficit hyperactivity disorder, predominantly hyperactive type: Secondary | ICD-10-CM

## 2020-02-25 NOTE — Telephone Encounter (Signed)
Please send to the pharmacy °

## 2020-03-23 ENCOUNTER — Other Ambulatory Visit (INDEPENDENT_AMBULATORY_CARE_PROVIDER_SITE_OTHER): Payer: Self-pay | Admitting: Family

## 2020-03-23 DIAGNOSIS — F901 Attention-deficit hyperactivity disorder, predominantly hyperactive type: Secondary | ICD-10-CM

## 2020-03-23 NOTE — Telephone Encounter (Signed)
Please send to the pharmacy °

## 2020-04-20 ENCOUNTER — Other Ambulatory Visit (INDEPENDENT_AMBULATORY_CARE_PROVIDER_SITE_OTHER): Payer: Self-pay | Admitting: Family

## 2020-04-20 DIAGNOSIS — F901 Attention-deficit hyperactivity disorder, predominantly hyperactive type: Secondary | ICD-10-CM

## 2020-04-20 NOTE — Telephone Encounter (Signed)
Please send to the pharmacy °

## 2020-05-24 ENCOUNTER — Other Ambulatory Visit (INDEPENDENT_AMBULATORY_CARE_PROVIDER_SITE_OTHER): Payer: Self-pay | Admitting: Family

## 2020-05-24 DIAGNOSIS — F901 Attention-deficit hyperactivity disorder, predominantly hyperactive type: Secondary | ICD-10-CM

## 2020-06-21 ENCOUNTER — Other Ambulatory Visit (INDEPENDENT_AMBULATORY_CARE_PROVIDER_SITE_OTHER): Payer: Self-pay | Admitting: Family

## 2020-06-21 DIAGNOSIS — F901 Attention-deficit hyperactivity disorder, predominantly hyperactive type: Secondary | ICD-10-CM

## 2020-06-21 NOTE — Telephone Encounter (Signed)
Approval for controlled medication

## 2020-07-23 ENCOUNTER — Other Ambulatory Visit (INDEPENDENT_AMBULATORY_CARE_PROVIDER_SITE_OTHER): Payer: Self-pay | Admitting: Family

## 2020-07-23 DIAGNOSIS — F901 Attention-deficit hyperactivity disorder, predominantly hyperactive type: Secondary | ICD-10-CM

## 2020-07-26 NOTE — Telephone Encounter (Signed)
Please send to the pharmacy °

## 2020-08-24 ENCOUNTER — Other Ambulatory Visit (INDEPENDENT_AMBULATORY_CARE_PROVIDER_SITE_OTHER): Payer: Self-pay | Admitting: Family

## 2020-08-24 DIAGNOSIS — F901 Attention-deficit hyperactivity disorder, predominantly hyperactive type: Secondary | ICD-10-CM

## 2020-08-24 NOTE — Telephone Encounter (Signed)
Please send to the pharmacy °

## 2020-09-20 ENCOUNTER — Other Ambulatory Visit: Payer: Self-pay | Admitting: Neurology

## 2020-09-20 DIAGNOSIS — F901 Attention-deficit hyperactivity disorder, predominantly hyperactive type: Secondary | ICD-10-CM

## 2020-09-21 NOTE — Telephone Encounter (Signed)
Please send to the pharmacy °

## 2020-10-21 ENCOUNTER — Other Ambulatory Visit (INDEPENDENT_AMBULATORY_CARE_PROVIDER_SITE_OTHER): Payer: Self-pay | Admitting: Family

## 2020-10-21 DIAGNOSIS — F901 Attention-deficit hyperactivity disorder, predominantly hyperactive type: Secondary | ICD-10-CM

## 2020-10-22 NOTE — Telephone Encounter (Signed)
Please send to the pharmacy °

## 2020-11-10 ENCOUNTER — Encounter (INDEPENDENT_AMBULATORY_CARE_PROVIDER_SITE_OTHER): Payer: Self-pay | Admitting: Family

## 2020-11-10 ENCOUNTER — Other Ambulatory Visit: Payer: Self-pay

## 2020-11-10 ENCOUNTER — Ambulatory Visit (INDEPENDENT_AMBULATORY_CARE_PROVIDER_SITE_OTHER): Payer: Commercial Managed Care - PPO | Admitting: Family

## 2020-11-10 VITALS — BP 114/76 | HR 78 | Ht 64.25 in | Wt 153.0 lb

## 2020-11-10 DIAGNOSIS — F988 Other specified behavioral and emotional disorders with onset usually occurring in childhood and adolescence: Secondary | ICD-10-CM | POA: Diagnosis not present

## 2020-11-10 NOTE — Progress Notes (Signed)
Eric Zuniga   MRN:  [id number]  1980/12/27   Provider: Rockwell Germany NP-C Location of Care: [facility]  Visit type: Follow Up Last visit: 01/06/2020  Referral source: No PCP History from: [facility] Chart, Patient  Brief history:  Copied from previous record: History of attention deficit disorder, inattentive type and migraine headaches. He is taking and tolerating generic Adderall IR, which has worked well for many years to focus his attention. He has intermittent migraines, which requires medication and sleep to obtain relief.  Today's concerns: Eric reports today that the generic Adderall IR continues to work well to focus his attention for the work day. He has no problems with appetite and sleeps well at night. He has more responsibilities at work that involve working behind a computer, and has found that on days that he doesn't take the afternoon dose that he is more distractible and has trouble getting work done.   Eric denies recent migraines. He has been otherwise generally healthy since he was last seen. He says that he watches his diet carefully and exercises most days.  He has no other health concerns today other than previously mentioned.  Review of systems: Please see HPI for neurologic and other pertinent review of systems. Otherwise all other systems were reviewed and were negative.  Problem List: Patient Active Problem List   Diagnosis Date Noted  . Attention deficit disorder predominant inattentive type 08/25/2014  . Attention deficit disorder with hyperactivity(314.01) 07/18/2013  . Migraine without aura 07/18/2013     Past Medical History:  Diagnosis Date  . Headache(784.0)     Past medical history comments: See HPI Copied from previous record: Patient suffered a head injury at the age of 8 due to a fall. He had an injury to his thumb 2014 while using a wood splitter that required surgical repair.  Surgical history: Past  Surgical History:  Procedure Laterality Date  . REPLANTATION THUMB Left Aug. 2014     Family history: family history includes Alzheimer's disease in his paternal grandmother; Heart attack in his paternal grandfather.   Social history: Social History   Socioeconomic History  . Marital status: Single    Spouse name: Not on file  . Number of children: Not on file  . Years of education: Not on file  . Highest education level: Not on file  Occupational History  . Not on file  Tobacco Use  . Smoking status: Never Smoker  . Smokeless tobacco: Never Used  Substance and Sexual Activity  . Alcohol use: No    Alcohol/week: 0.0 standard drinks  . Drug use: No  . Sexual activity: Yes  Other Topics Concern  . 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    Social Determinants of Health   Financial Resource Strain: Not on file  Food Insecurity: Not on file  Transportation Needs: Not on file  Physical Activity: Not on file  Stress: Not on file  Social Connections: Not on file  Intimate Partner Violence: Not on file    Past/failed meds:  Allergies: No Known Allergies   Immunizations:  There is no immunization history on file for this patient.   Diagnostics/Screenings:  Physical Exam: BP 114/76   Pulse 78   Ht 5' 4.25" (1.632 m)   Wt 153 lb (69.4 kg)   BMI 26.06 kg/m   General: Well developed, well  nourished man, seated on exam table, in no evident distress, brown hair, brown eyes, right handed Head: Head normocephalic and atraumatic.  Oropharynx benign. Neck: Supple Cardiovascular: Regular rate and rhythm, no murmurs Respiratory: Breath sounds clear to auscultation Musculoskeletal: No obvious deformities or scoliosis Skin: No rashes or neurocutaneous lesions  Neurologic Exam Mental Status: Awake and fully alert.  Oriented to place and time.  Recent and remote memory  intact.  Attention span, concentration, and fund of knowledge appropriate.  Mood and affect appropriate. Cranial Nerves: Fundoscopic exam reveals sharp disc margins.  Pupils equal, briskly reactive to light.  Extraocular movements full without nystagmus.  Visual fields full to confrontation.  Hearing intact and symmetric to finger rub.  Facial sensation intact.  Face tongue, palate move normally and symmetrically.  Neck flexion and extension normal. Motor: Normal bulk and tone. Normal strength in all tested extremity muscles. Sensory: Intact to touch and temperature in all extremities.  Coordination: Rapid alternating movements normal in all extremities.  Finger-to-nose and heel-to shin performed accurately bilaterally.  Romberg negative. Gait and Station: Arises from chair without difficulty.  Stance is normal. Gait demonstrates normal stride length and balance.   Able to heel, toe and tandem walk without difficulty. Reflexes: 1+ and symmetric. Toes downgoing.  Impression: 1. Attention deficit disorder, inattentive type 2. Migraine without aura  Recommendations for plan of care: The patient's previous [facility] records were reviewed. Eric has neither had nor required imaging or lab studies since the last visit. He is a 40 year old man with history of attention deficit disorder, inattentive type and migraine headaches. He is taking and tolerating generic Adderall IR which works well to focus his attention. Migraines have been intermittent and not problematic for him. Eric will continue the generic Adderall IR as ordered, and will return for follow up in 1 year or sooner if needed.   The medication list was reviewed and reconciled. No changes were made in the prescribed medications today. A complete medication list was provided to the patient.  No orders of the defined types were placed in this encounter.    Allergies as of 11/10/2020   No Known Allergies     Medication List       Accurate  as of November 10, 2020  3:57 PM. If you have any questions, ask your nurse or doctor.        amphetamine-dextroamphetamine 30 MG tablet Commonly known as: ADDERALL TAKE 1 TABLET EVERY MORNING AND 1/2 TABLET EVERY AFTERNOON       Total time spent with the patient was 20 minutes, of which 50% or more was spent in counseling and coordination of care.  Rockwell Germany NP-C [facility] Ph. [phone] Fax [phone]

## 2020-11-11 ENCOUNTER — Encounter (INDEPENDENT_AMBULATORY_CARE_PROVIDER_SITE_OTHER): Payer: Self-pay | Admitting: Family

## 2020-11-11 NOTE — Patient Instructions (Signed)
Thank you for coming in today.   Instructions for you until your next appointment are as follows: 1. Continue the generic Adderall as you have been taking it.  2. Let me know if you have any questions or concerns.  3. Please sign up for MyChart if you have not done so. 4. Please plan to return for follow up in one year or sooner if needed.

## 2020-11-22 ENCOUNTER — Other Ambulatory Visit (INDEPENDENT_AMBULATORY_CARE_PROVIDER_SITE_OTHER): Payer: Self-pay | Admitting: Family

## 2020-11-22 DIAGNOSIS — F901 Attention-deficit hyperactivity disorder, predominantly hyperactive type: Secondary | ICD-10-CM

## 2020-11-22 MED ORDER — AMPHETAMINE-DEXTROAMPHETAMINE 30 MG PO TABS: ORAL_TABLET | ORAL | 0 refills | Status: DC

## 2020-11-22 MED ORDER — AMPHETAMINE-DEXTROAMPHETAMINE 30 MG PO TABS
ORAL_TABLET | ORAL | 0 refills | Status: DC
Start: 2021-01-17 — End: 2021-02-22

## 2020-11-22 NOTE — Telephone Encounter (Signed)
Please send to the pharmacy if applicable

## 2020-12-13 ENCOUNTER — Ambulatory Visit: Payer: Self-pay | Admitting: Dermatology

## 2020-12-20 ENCOUNTER — Other Ambulatory Visit: Payer: Self-pay

## 2020-12-20 ENCOUNTER — Ambulatory Visit (INDEPENDENT_AMBULATORY_CARE_PROVIDER_SITE_OTHER): Payer: Commercial Managed Care - PPO | Admitting: Dermatology

## 2020-12-20 DIAGNOSIS — D489 Neoplasm of uncertain behavior, unspecified: Secondary | ICD-10-CM | POA: Diagnosis not present

## 2020-12-20 DIAGNOSIS — Z808 Family history of malignant neoplasm of other organs or systems: Secondary | ICD-10-CM | POA: Diagnosis not present

## 2020-12-20 MED ORDER — TACROLIMUS 0.1 % EX OINT
TOPICAL_OINTMENT | CUTANEOUS | 1 refills | Status: DC
Start: 2020-12-20 — End: 2021-06-27

## 2020-12-20 NOTE — Patient Instructions (Signed)

## 2020-12-20 NOTE — Progress Notes (Signed)
   New Patient Visit  Subjective  Eric Zuniga is a 40 y.o. male who presents for the following: New Patient (Initial Visit) (Patient is here today with concerns for a spot on lips that comes and goes. He states it has been there for a while. Reports that sun affects it. Denies any pain but just notices it. States it just looks like a blister and doesn't go away. ). Patient has family history of skin cancer in patient's father.   Objective  Well appearing patient in no apparent distress; mood and affect are within normal limits.  A focused examination was performed including lower lip. Relevant physical exam findings are noted in the Assessment and Plan.  Objective  left of midline Lower Vermilion Lip: 0.8 x 0.5 cm hyperkeratotic plaque   Assessment & Plan  Cheilitis - 2ndary to  recent injury where tooth went through lip -possible scar?  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   0.8x0.5 cm hyperkeratotic plaque Left of midline lower lip vermillion left of midline Lower Vermilion Lip  No history of tobacco product use. No significant sun damage changes today. Photo today  Start Tacrolimus 0.1 % ointment apply to affected area of lip 2 to 4 times daily for 6 weeks.   May consider biopsy and/or Ln2 in future if protopic does not help.   Ordered Medications: tacrolimus (PROTOPIC) 0.1 % ointment  Return in about 6 weeks (around 01/31/2021) for follow up.   IRuthell Rummage, CMA, am acting as scribe for Sarina Ser, MD.  Documentation: I have reviewed the above documentation for accuracy and completeness, and I agree with the above.  Sarina Ser, MD

## 2020-12-21 ENCOUNTER — Encounter: Payer: Self-pay | Admitting: Dermatology

## 2021-01-02 ENCOUNTER — Encounter (INDEPENDENT_AMBULATORY_CARE_PROVIDER_SITE_OTHER): Payer: Self-pay

## 2021-02-07 ENCOUNTER — Ambulatory Visit: Payer: Commercial Managed Care - PPO | Admitting: Dermatology

## 2021-02-21 ENCOUNTER — Other Ambulatory Visit (INDEPENDENT_AMBULATORY_CARE_PROVIDER_SITE_OTHER): Payer: Self-pay | Admitting: Family

## 2021-02-21 DIAGNOSIS — F901 Attention-deficit hyperactivity disorder, predominantly hyperactive type: Secondary | ICD-10-CM

## 2021-02-22 ENCOUNTER — Other Ambulatory Visit (INDEPENDENT_AMBULATORY_CARE_PROVIDER_SITE_OTHER): Payer: Self-pay | Admitting: Family

## 2021-02-22 DIAGNOSIS — F901 Attention-deficit hyperactivity disorder, predominantly hyperactive type: Secondary | ICD-10-CM

## 2021-02-22 MED ORDER — AMPHETAMINE-DEXTROAMPHETAMINE 30 MG PO TABS: ORAL_TABLET | ORAL | 0 refills | Status: DC

## 2021-02-22 NOTE — Telephone Encounter (Signed)
Please send to the pharmacy °

## 2021-04-28 ENCOUNTER — Ambulatory Visit: Payer: Commercial Managed Care - PPO | Admitting: Dermatology

## 2021-05-19 ENCOUNTER — Ambulatory Visit: Payer: Commercial Managed Care - PPO | Admitting: Dermatology

## 2021-05-24 ENCOUNTER — Other Ambulatory Visit (INDEPENDENT_AMBULATORY_CARE_PROVIDER_SITE_OTHER): Payer: Self-pay | Admitting: Family

## 2021-05-24 DIAGNOSIS — F901 Attention-deficit hyperactivity disorder, predominantly hyperactive type: Secondary | ICD-10-CM

## 2021-06-15 ENCOUNTER — Ambulatory Visit: Payer: Commercial Managed Care - PPO | Admitting: Dermatology

## 2021-06-23 ENCOUNTER — Other Ambulatory Visit (INDEPENDENT_AMBULATORY_CARE_PROVIDER_SITE_OTHER): Payer: Self-pay | Admitting: Family

## 2021-06-23 DIAGNOSIS — F901 Attention-deficit hyperactivity disorder, predominantly hyperactive type: Secondary | ICD-10-CM

## 2021-06-23 MED ORDER — AMPHETAMINE-DEXTROAMPHETAMINE 30 MG PO TABS: ORAL_TABLET | ORAL | 0 refills | Status: DC

## 2021-06-24 ENCOUNTER — Other Ambulatory Visit (INDEPENDENT_AMBULATORY_CARE_PROVIDER_SITE_OTHER): Payer: Self-pay | Admitting: Family

## 2021-06-24 ENCOUNTER — Telehealth (INDEPENDENT_AMBULATORY_CARE_PROVIDER_SITE_OTHER): Payer: Self-pay

## 2021-06-24 DIAGNOSIS — F901 Attention-deficit hyperactivity disorder, predominantly hyperactive type: Secondary | ICD-10-CM

## 2021-06-24 MED ORDER — AMPHETAMINE-DEXTROAMPHETAMINE 30 MG PO TABS: ORAL_TABLET | ORAL | 0 refills | Status: DC

## 2021-06-24 NOTE — Telephone Encounter (Signed)
I called the pharmacy and confirmed receipt of of the Rx. I called Mom and told her that the pharmacy has the Rx. She had no other questions. TG

## 2021-06-24 NOTE — Telephone Encounter (Signed)
I was transferred this phone call, as Raquel Sarna, office manager is out of the office and mom was getting upset with the front office worker. Mom stated that she has not been able to pick up her son's Adderall for 2 days, stating the pharmacy told her that they have not received a prescription refill. Mom also stated that the pharmacy closes at 16, and her son is going out of town tomorrow, and needs to have this medication today. I explained to mom that there was a refill sent in yesterday at 1:36 PM and there is a receipt confirmed by pharmacy on the electronic refill. Mom said she will call the pharmacy again and check on that. Mom stated that she needs to speak to Rockwell Germany, NP), and that she needs a return call today, as she is wanting to figure out what is going on with a few things.

## 2021-06-27 ENCOUNTER — Ambulatory Visit (INDEPENDENT_AMBULATORY_CARE_PROVIDER_SITE_OTHER): Payer: Commercial Managed Care - PPO | Admitting: Dermatology

## 2021-06-27 ENCOUNTER — Other Ambulatory Visit: Payer: Self-pay

## 2021-06-27 DIAGNOSIS — L578 Other skin changes due to chronic exposure to nonionizing radiation: Secondary | ICD-10-CM

## 2021-06-27 DIAGNOSIS — B078 Other viral warts: Secondary | ICD-10-CM | POA: Diagnosis not present

## 2021-06-27 DIAGNOSIS — D489 Neoplasm of uncertain behavior, unspecified: Secondary | ICD-10-CM

## 2021-06-27 NOTE — Patient Instructions (Signed)
Wound Care Instructions  Cleanse wound gently with soap and water once a day then pat dry with clean gauze. Apply a thing coat of Petrolatum (petroleum jelly, "Vaseline") over the wound (unless you have an allergy to this). We recommend that you use a new, sterile tube of Vaseline. Do not pick or remove scabs. Do not remove the yellow or white "healing tissue" from the base of the wound.  Cover the wound with fresh, clean, nonstick gauze and secure with paper tape. You may use Band-Aids in place of gauze and tape if the would is small enough, but would recommend trimming much of the tape off as there is often too much. Sometimes Band-Aids can irritate the skin.  You should call the office for your biopsy report after 1 week if you have not already been contacted.  If you experience any problems, such as abnormal amounts of bleeding, swelling, significant bruising, significant pain, or evidence of infection, please call the office immediately.  FOR ADULT SURGERY PATIENTS: If you need something for pain relief you may take 1 extra strength Tylenol (acetaminophen) AND 2 Ibuprofen (200mg each) together every 4 hours as needed for pain. (do not take these if you are allergic to them or if you have a reason you should not take them.) Typically, you may only need pain medication for 1 to 3 days.   If you have any questions or concerns for your doctor, please call our main line at [phone] and press option 4 to reach your doctor's medical assistant. If no one answers, please leave a voicemail as directed and we will return your call as soon as possible. Messages left after 4 pm will be answered the following business day.   You may also send us a message via MyChart. We typically respond to MyChart messages within 1-2 business days.  For prescription refills, please ask your pharmacy to contact our office. Our fax number is [phone].  If you have an urgent issue when the clinic is closed that  cannot wait until the next business day, you can page your doctor at the number below.    Please note that while we do our best to be available for urgent issues outside of office hours, we are not available 24/7.   If you have an urgent issue and are unable to reach us, you may choose to seek medical care at your doctor's office, retail clinic, urgent care center, or emergency room.  If you have a medical emergency, please immediately call 911 or go to the emergency department.  Pager Numbers  - Dr. Kowalski: [phone]  - Dr. Moye: [phone]  - Dr. Stewart: [phone]  In the event of inclement weather, please call our main line at [phone] for an update on the status of any delays or closures.  Dermatology Medication Tips: Please keep the boxes that topical medications come in in order to help keep track of the instructions about where and how to use these. Pharmacies typically print the medication instructions only on the boxes and not directly on the medication tubes.   If your medication is too expensive, please contact our office at [phone] option 4 or send us a message through MyChart.   We are unable to tell what your co-pay for medications will be in advance as this is different depending on your insurance coverage. However, we may be able to find a substitute medication at lower cost or fill out paperwork to get insurance to cover a needed   medication.   If a prior authorization is required to get your medication covered by your insurance company, please allow us 1-2 business days to complete this process.  Drug prices often vary depending on where the prescription is filled and some pharmacies may offer cheaper prices.  The website www.goodrx.com contains coupons for medications through different pharmacies. The prices here do not account for what the cost may be with help from insurance (it may be cheaper with your insurance), but the website can give you the  price if you did not use any insurance.  - You can print the associated coupon and take it with your prescription to the pharmacy.  - You may also stop by our office during regular business hours and pick up a GoodRx coupon card.  - If you need your prescription sent electronically to a different pharmacy, notify our office through [facility] MyChart or by phone at [phone] option 4.   

## 2021-06-27 NOTE — Progress Notes (Signed)
   Follow-Up Visit   Subjective  Eric Zuniga is a 40 y.o. male who presents for the following: Follow-up (Cheilitis - 2ndary to  recent injury where tooth went through lip -possible scar?  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 ).  He does not use tobacco products.  He does not recall significant sunburns but has had some sun damage.  The following portions of the chart were reviewed this encounter and updated as appropriate:   Tobacco  Allergies  Meds  Problems  Med Hx  Surg Hx  Fam Hx     Review of Systems:  No other skin or systemic complaints except as noted in HPI or Assessment and Plan.  Objective  Well appearing patient in no apparent distress; mood and affect are within normal limits.  A focused examination was performed including lower lip. Relevant physical exam findings are noted in the Assessment and Plan.  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 Anesthesia: the lesion was anesthetized in a standard fashion   Anesthetic:  1% lidocaine w/ epinephrine 1-100,000 buffered w/ 8.4% NaHCO3 Instrument used: #15 blade   Hemostasis achieved with: pressure, aluminum chloride and electrodesiccation   Outcome: patient tolerated procedure well   Post-procedure details: wound care instructions given   Post-procedure details comment:  Ointment and small bandage applied  Specimen 1 - Surgical pathology Differential Diagnosis: Viral wart vs AK vs SCC vs Leukoplakia Check Margins: No  No history of tobacco product use. No significant sun burns in the past that he can remember.  Actinic Damage - chronic, secondary to cumulative UV radiation exposure/sun exposure over time - diffuse  scaly erythematous macules with underlying dyspigmentation - Recommend daily broad spectrum sunscreen SPF 30+ to sun-exposed areas, reapply every 2 hours as needed.  - Recommend staying in the shade or wearing long sleeves, sun glasses (UVA+UVB protection) and wide brim hats (4-inch brim around the entire circumference of the hat). - Call for new or changing lesions.  Return pending biopsy results.  I, Ashok Cordia, CMA, am acting as scribe for Sarina Ser, MD . Documentation: I have reviewed the above documentation for accuracy and completeness, and I agree with the above.  Sarina Ser, MD

## 2021-06-28 ENCOUNTER — Encounter: Payer: Self-pay | Admitting: Dermatology

## 2021-06-29 ENCOUNTER — Telehealth: Payer: Self-pay

## 2021-06-29 NOTE — Telephone Encounter (Signed)
-----   Message from Ralene Bathe, MD sent at 06/29/2021  5:03 PM EDT ----- Diagnosis Skin , mid lower vermilion lip VERRUCA VULGARIS, INFLAMED  Benign inflamed viral wart May recur - sometimes difficult to eradicate No further treatment needed at this time unless evidence of recurrence

## 2021-06-29 NOTE — Telephone Encounter (Signed)
Advised patient of results/hd  

## 2021-09-07 ENCOUNTER — Ambulatory Visit: Payer: Commercial Managed Care - PPO | Admitting: Dermatology

## 2021-09-23 ENCOUNTER — Other Ambulatory Visit (INDEPENDENT_AMBULATORY_CARE_PROVIDER_SITE_OTHER): Payer: Self-pay | Admitting: Family

## 2021-09-23 ENCOUNTER — Telehealth (INDEPENDENT_AMBULATORY_CARE_PROVIDER_SITE_OTHER): Payer: Self-pay

## 2021-09-23 DIAGNOSIS — F901 Attention-deficit hyperactivity disorder, predominantly hyperactive type: Secondary | ICD-10-CM

## 2021-09-23 MED ORDER — AMPHETAMINE-DEXTROAMPHETAMINE 30 MG PO TABS: ORAL_TABLET | ORAL | 0 refills | Status: DC

## 2021-09-23 NOTE — Addendum Note (Signed)
Addended by: Su Grand on: 09/23/2021 04:44 PM   Modules accepted: Level of Service

## 2021-09-23 NOTE — Telephone Encounter (Signed)
Mother requested to speak with me. I called her back on the mobile phone  and home number listed. Left voice message to call back on both.

## 2021-11-17 ENCOUNTER — Ambulatory Visit (INDEPENDENT_AMBULATORY_CARE_PROVIDER_SITE_OTHER): Payer: Commercial Managed Care - PPO | Admitting: Family

## 2021-11-21 ENCOUNTER — Other Ambulatory Visit (INDEPENDENT_AMBULATORY_CARE_PROVIDER_SITE_OTHER): Payer: Self-pay | Admitting: Family

## 2021-11-21 DIAGNOSIS — F901 Attention-deficit hyperactivity disorder, predominantly hyperactive type: Secondary | ICD-10-CM

## 2021-12-20 ENCOUNTER — Ambulatory Visit (INDEPENDENT_AMBULATORY_CARE_PROVIDER_SITE_OTHER): Payer: Commercial Managed Care - PPO | Admitting: Family

## 2021-12-23 ENCOUNTER — Other Ambulatory Visit (INDEPENDENT_AMBULATORY_CARE_PROVIDER_SITE_OTHER): Payer: Self-pay | Admitting: Family

## 2021-12-23 DIAGNOSIS — F901 Attention-deficit hyperactivity disorder, predominantly hyperactive type: Secondary | ICD-10-CM

## 2022-01-10 ENCOUNTER — Ambulatory Visit (INDEPENDENT_AMBULATORY_CARE_PROVIDER_SITE_OTHER): Payer: Commercial Managed Care - PPO | Admitting: Family

## 2022-01-10 ENCOUNTER — Encounter (INDEPENDENT_AMBULATORY_CARE_PROVIDER_SITE_OTHER): Payer: Self-pay | Admitting: Family

## 2022-01-10 VITALS — BP 110/68 | HR 80 | Wt 144.8 lb

## 2022-01-10 DIAGNOSIS — F988 Other specified behavioral and emotional disorders with onset usually occurring in childhood and adolescence: Secondary | ICD-10-CM | POA: Diagnosis not present

## 2022-01-10 NOTE — Patient Instructions (Signed)
It was a pleasure to see you today! ? ?Instructions for you until your next appointment are as follows: ?Continue taking the Adderall as prescribed ?Work on getting established with a PCP as we discussed today ?Please sign up for MyChart if you have not done so. ?Please plan to return for follow up in one year or sooner if needed. ? ?Feel free to contact our office during normal business hours at [phone] with questions or concerns. If there is no answer or the call is outside business hours, please leave a message and our clinic staff will call you back within the next business day.  If you have an urgent concern, please stay on the line for our after-hours answering service and ask for the on-call neurologist.   ?  ?I also encourage you to use MyChart to communicate with me more directly. If you have not yet signed up for MyChart within [facility], the front desk staff can help you. However, please note that this inbox is NOT monitored on nights or weekends, and response can take up to 2 business days.  Urgent matters should be discussed with the on-call pediatric neurologist.  ? ?At [facility], we are committed to providing exceptional care. You will receive a patient satisfaction survey through text or email regarding your visit today. Your opinion is important to me. Comments are appreciated.   ?

## 2022-01-10 NOTE — Progress Notes (Signed)
Eric Zuniga   MRN:  [id number]  25-Nov-1980   Provider: Rockwell Germany NP-C Location of Care: [facility]  Visit type: Return visit  Last visit: 11/10/2020  Referral source: no PCP History from: Epic chart and patient  Brief history:  Copied from previous record: History of attention deficit disorder, inattentive type and migraine headaches. He is taking and tolerating generic Adderall IR, which has worked well for many years to focus his attention. He has intermittent migraines, which requires medication and sleep to obtain relief.  Today's concerns: Eric reports today that the Adderall IR continues to work well for his work day. He is unable to focus and do work if he does not take the medication. He has no problems with appetite and sleeps well at night. He reports that he has not experienced migraine headache in some time.   Eric has been otherwise generally healthy since he was last seen. He has no other health concerns today other than previously mentioned.  Review of systems: Please see HPI for neurologic and other pertinent review of systems. Otherwise all other systems were reviewed and were negative.  Problem List: Patient Active Problem List   Diagnosis Date Noted   Attention deficit disorder predominant inattentive type 08/25/2014   Attention deficit disorder with hyperactivity(314.01) 07/18/2013   Migraine without aura 07/18/2013     Past Medical History:  Diagnosis Date   Headache(784.0)     Past medical history comments: See HPI Copied from previous record: Patient suffered a head injury at the age of 59 due to a fall. He had an injury to his thumb 2014 while using a wood splitter that required surgical repair.  Surgical history: Past Surgical History:  Procedure Laterality Date   REPLANTATION THUMB Left Aug. 2014     Family history: family history includes Alzheimer's disease in his paternal grandmother; Heart attack  in his paternal grandfather.   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    Social Determinants of Health   Financial Resource Strain: Not on file  Food Insecurity: Not on file  Transportation Needs: Not on file  Physical Activity: Not on file  Stress: Not on file  Social Connections: Not on file  Intimate Partner Violence: Not on file    Past/failed meds:  Allergies: No Known Allergies    Immunizations:  There is no immunization history on file for this patient.   Diagnostics/Screenings:  Physical Exam: BP 110/68   Pulse 80   Wt 144 lb 12.8 oz (65.7 kg)   BMI 24.66 kg/m   General: Well developed, well nourished man, seated on exam table, in no evident distress Head: Head normocephalic and atraumatic.  Oropharynx benign. Neck: Supple Cardiovascular: Regular rate and rhythm, no murmurs Respiratory: Breath sounds clear to auscultation Musculoskeletal: No  obvious deformities or scoliosis Skin: No rashes or neurocutaneous lesions  Neurologic Exam Mental Status: Awake and fully alert.  Oriented to place and time.  Recent and remote memory intact.  Attention span, concentration, and fund of knowledge appropriate.  Mood and affect appropriate. Cranial Nerves: Fundoscopic exam reveals sharp disc margins.  Pupils equal, briskly reactive to light.  Extraocular movements full without nystagmus. Hearing intact  and symmetric to whisper.  Facial sensation intact.  Face tongue, palate move normally and symmetrically. Shoulder shrug normal Motor: Normal bulk and tone. Normal strength in all tested extremity muscles. Sensory: Intact to touch and temperature in all extremities.  Coordination: Rapid alternating movements normal in all extremities.  Finger-to-nose and heel-to shin performed accurately bilaterally.  Romberg negative. Gait and Station: Arises from chair without difficulty.  Stance is normal. Gait demonstrates normal stride length and balance.   Able to heel, toe and tandem walk without difficulty. Reflexes: 1+ and symmetric. Toes downgoing.   Impression: Attention deficit disorder predominant inattentive type   Recommendations for plan of care: The patient's previous Epic records were reviewed. Eric has neither had nor required imaging or lab studies since the last visit. He is a 41 year old man with history of ADHD. He has taken Adderall IR for many years that has worked well to focus his attention. I talked with Eric about getting established with a PCP and to look for a provider that will prescribe the Adderall IR as he is an adult patient that is still being seen in an pediatric practice. I told him that I will be approaching retirement and that it is important for him to have a provider that can assume his care. I will otherwise see him back in follow up in 1 year or sooner if needed. He agreed with the plans made today.   The medication list was reviewed and reconciled. No changes were made in the prescribed medications today. A complete medication list was provided to the patient.  Return in about 1 year (around 01/11/2023).   Allergies as of 01/10/2022   No Known Allergies      Medication List        Accurate as of Jan 10, 2022  7:57 PM. If you have any questions, ask your nurse or doctor.          amphetamine-dextroamphetamine 30 MG tablet Commonly known as:  ADDERALL TAKE ONE TABLET EVERY MORNING AND 1/2 TABLET EVERY AFTERNOON      Total time spent with the patient was 20 minutes, of which 50% or more was spent in counseling and coordination of care.  Rockwell Germany NP-C [facility] Ph. [phone] Fax [phone]

## 2022-01-13 ENCOUNTER — Encounter (INDEPENDENT_AMBULATORY_CARE_PROVIDER_SITE_OTHER): Payer: Self-pay | Admitting: Family

## 2022-01-24 ENCOUNTER — Other Ambulatory Visit (INDEPENDENT_AMBULATORY_CARE_PROVIDER_SITE_OTHER): Payer: Self-pay | Admitting: Family

## 2022-01-24 DIAGNOSIS — F901 Attention-deficit hyperactivity disorder, predominantly hyperactive type: Secondary | ICD-10-CM

## 2022-03-08 ENCOUNTER — Other Ambulatory Visit (INDEPENDENT_AMBULATORY_CARE_PROVIDER_SITE_OTHER): Payer: Self-pay | Admitting: Family

## 2022-03-08 DIAGNOSIS — F901 Attention-deficit hyperactivity disorder, predominantly hyperactive type: Secondary | ICD-10-CM

## 2022-03-23 ENCOUNTER — Other Ambulatory Visit (INDEPENDENT_AMBULATORY_CARE_PROVIDER_SITE_OTHER): Payer: Self-pay | Admitting: Family

## 2022-03-23 DIAGNOSIS — F901 Attention-deficit hyperactivity disorder, predominantly hyperactive type: Secondary | ICD-10-CM

## 2022-05-22 ENCOUNTER — Other Ambulatory Visit (INDEPENDENT_AMBULATORY_CARE_PROVIDER_SITE_OTHER): Payer: Self-pay | Admitting: Family

## 2022-05-22 DIAGNOSIS — F901 Attention-deficit hyperactivity disorder, predominantly hyperactive type: Secondary | ICD-10-CM

## 2022-05-24 ENCOUNTER — Other Ambulatory Visit (INDEPENDENT_AMBULATORY_CARE_PROVIDER_SITE_OTHER): Payer: Self-pay | Admitting: Family

## 2022-05-24 DIAGNOSIS — F901 Attention-deficit hyperactivity disorder, predominantly hyperactive type: Secondary | ICD-10-CM

## 2022-07-25 ENCOUNTER — Other Ambulatory Visit (INDEPENDENT_AMBULATORY_CARE_PROVIDER_SITE_OTHER): Payer: Self-pay | Admitting: Family

## 2022-07-25 DIAGNOSIS — F901 Attention-deficit hyperactivity disorder, predominantly hyperactive type: Secondary | ICD-10-CM

## 2022-07-25 NOTE — Telephone Encounter (Signed)
Seen 01/10/22 and needs to sched follow up for 12/2022 dose is correct

## 2022-07-26 ENCOUNTER — Other Ambulatory Visit (INDEPENDENT_AMBULATORY_CARE_PROVIDER_SITE_OTHER): Payer: Self-pay | Admitting: Family

## 2022-07-26 DIAGNOSIS — F901 Attention-deficit hyperactivity disorder, predominantly hyperactive type: Secondary | ICD-10-CM

## 2022-07-26 NOTE — Telephone Encounter (Signed)
Spoke with [facility]. She stated they already got Rx (11-28) "but the patient insists the pharmacy send one for the next Rx refill" (per patient).

## 2022-07-26 NOTE — Telephone Encounter (Signed)
I sent in updated Rx for December 28th. TG

## 2022-09-25 ENCOUNTER — Other Ambulatory Visit (INDEPENDENT_AMBULATORY_CARE_PROVIDER_SITE_OTHER): Payer: Self-pay | Admitting: Family

## 2022-09-25 DIAGNOSIS — F901 Attention-deficit hyperactivity disorder, predominantly hyperactive type: Secondary | ICD-10-CM

## 2022-09-25 NOTE — Telephone Encounter (Signed)
Seen 01/10/22 follow up in 1 yr. Needs to sched appt.

## 2022-09-26 NOTE — Telephone Encounter (Signed)
Seen 01/10/22 to follow up in 1 yr needs to sched appt. Rx written 09/25/22 Patient and pharm request a rx to fill in 1 month. Request sent to provider

## 2022-11-09 ENCOUNTER — Other Ambulatory Visit (INDEPENDENT_AMBULATORY_CARE_PROVIDER_SITE_OTHER): Payer: Self-pay | Admitting: Family

## 2022-11-09 DIAGNOSIS — F901 Attention-deficit hyperactivity disorder, predominantly hyperactive type: Secondary | ICD-10-CM

## 2022-11-22 ENCOUNTER — Other Ambulatory Visit (INDEPENDENT_AMBULATORY_CARE_PROVIDER_SITE_OTHER): Payer: Self-pay | Admitting: Family

## 2022-11-22 DIAGNOSIS — F901 Attention-deficit hyperactivity disorder, predominantly hyperactive type: Secondary | ICD-10-CM

## 2022-12-25 ENCOUNTER — Other Ambulatory Visit (INDEPENDENT_AMBULATORY_CARE_PROVIDER_SITE_OTHER): Payer: Self-pay | Admitting: Family

## 2022-12-25 DIAGNOSIS — F901 Attention-deficit hyperactivity disorder, predominantly hyperactive type: Secondary | ICD-10-CM

## 2022-12-25 NOTE — Telephone Encounter (Signed)
Last OV 01/10/2022 Follow up in  32yr but is not scheduled- requested front office call and sched patient.

## 2023-01-04 ENCOUNTER — Encounter (INDEPENDENT_AMBULATORY_CARE_PROVIDER_SITE_OTHER): Payer: Self-pay | Admitting: Family

## 2023-01-04 ENCOUNTER — Ambulatory Visit (INDEPENDENT_AMBULATORY_CARE_PROVIDER_SITE_OTHER): Payer: Commercial Managed Care - PPO | Admitting: Family

## 2023-01-04 VITALS — BP 120/68 | HR 88 | Wt 155.0 lb

## 2023-01-04 DIAGNOSIS — F901 Attention-deficit hyperactivity disorder, predominantly hyperactive type: Secondary | ICD-10-CM | POA: Diagnosis not present

## 2023-01-04 DIAGNOSIS — G43009 Migraine without aura, not intractable, without status migrainosus: Secondary | ICD-10-CM | POA: Diagnosis not present

## 2023-01-04 MED ORDER — AMPHETAMINE-DEXTROAMPHETAMINE 30 MG PO TABS: ORAL_TABLET | ORAL | 0 refills | Status: DC

## 2023-01-04 MED ORDER — AMPHETAMINE-DEXTROAMPHETAMINE 30 MG PO TABS
ORAL_TABLET | ORAL | 0 refills | Status: DC
Start: 2023-02-23 — End: 2023-04-24

## 2023-01-07 ENCOUNTER — Encounter (INDEPENDENT_AMBULATORY_CARE_PROVIDER_SITE_OTHER): Payer: Self-pay | Admitting: Family

## 2023-01-07 DIAGNOSIS — F901 Attention-deficit hyperactivity disorder, predominantly hyperactive type: Secondary | ICD-10-CM | POA: Insufficient documentation

## 2023-01-07 NOTE — Progress Notes (Signed)
Eric Zuniga   MRN:  [id number]  04-10-1981   Provider: Elveria Rising NP-C Location of Care: [facility]  Visit type: Return visit  Last visit: 11/10/2020  Referral source: Patient, No Pcp Per History from: Epic chart and patient  Brief history:  Copied from previous record: History of attention deficit disorder, inattentive type and migraine headaches. He is taking and tolerating generic Adderall IR, which has worked well for many years to focus his attention. He has intermittent migraines, which requires medication and sleep to obtain relief.  Today's concerns: Adderall IR continues to work well to focus his attention for the work day. He does not take the medication when not working.  Has occasional migraine headaches. They are typically managed well with Tylenol and rest He has been under some unusual stress as his mother is being treated for cancer at this time. Eric has been otherwise generally healthy since he was last seen. No health concerns today other than previously mentioned.  Review of systems: Please see HPI for neurologic and other pertinent review of systems. Otherwise all other systems were reviewed and were negative.  Problem List: Patient Active Problem List   Diagnosis Date Noted   Attention deficit disorder predominant inattentive type 08/25/2014   Attention deficit disorder with hyperactivity(314.01) 07/18/2013   Migraine without aura 07/18/2013     Past Medical History:  Diagnosis Date   Headache(784.0)     Past medical history comments: See HPI Copied from previous record: Patient suffered a head injury at the age of 8 due to a fall. He had an injury to his thumb 2014 while using a wood splitter that required surgical repair.  Surgical history: Past Surgical History:  Procedure Laterality Date   REPLANTATION THUMB Left Aug. 2014     Family history: family history includes  Alzheimer's disease in his paternal grandmother; Heart attack in his paternal grandfather.   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 He enjoys skiing, soccer, and golf.    Social Determinants of Health   Financial Resource Strain: Not on file  Food Insecurity: Not on file  Transportation Needs: Not on file  Physical Activity: Not on file  Stress: Not on file  Social Connections: Not on file  Intimate Partner Violence: Not on file    Past/failed meds: Copied from previous record:  Allergies: No Known Allergies   Immunizations:  There is no immunization history on file for this patient.   Diagnostics/Screenings:  Physical Exam: BP 120/68   Pulse 88   Wt 154 lb 15.7 oz (70.3 kg)   BMI 26.40 kg/m   General: Well developed, well nourished man, seated on exam table, in no evident distress Head: Head normocephalic and atraumatic.  Oropharynx benign. Neck: Supple Cardiovascular: Regular rate and rhythm, no murmurs  Respiratory: Breath sounds clear to auscultation Musculoskeletal: No obvious deformities or scoliosis Skin: No rashes or neurocutaneous lesions  Neurologic Exam Mental Status: Awake and fully alert.  Oriented to place and time.  Recent and remote memory intact.  Attention span, concentration, and fund of knowledge appropriate.  Mood and affect appropriate. Cranial Nerves: Fundoscopic exam reveals sharp disc margins.   Pupils equal, briskly reactive to light.  Extraocular movements full without nystagmus. Hearing intact and symmetric to whisper.  Facial sensation intact.  Face tongue, palate move normally and symmetrically. Shoulder shrug normal Motor: Normal bulk and tone. Normal strength in all tested extremity muscles. Sensory: Intact to touch and temperature in all extremities.  Coordination: Rapid alternating movements normal in all extremities.  Finger-to-nose and heel-to shin performed accurately bilaterally.  Romberg negative. Gait and Station: Arises from chair without difficulty.  Stance is normal. Gait demonstrates normal stride length and balance.   Able to heel, toe and tandem walk without difficulty. Reflexes: 1+ and symmetric. Toes downgoing.   Impression: Attention-deficit hyperactivity disorder, predominantly hyperactive type - Plan: amphetamine-dextroamphetamine (ADDERALL) 30 MG tablet, amphetamine-dextroamphetamine (ADDERALL) 30 MG tablet  Migraine without aura and without status migrainosus, not intractable   Recommendations for plan of care: The patient's previous Epic records were reviewed. No recent diagnostic studies to be reviewed with the patient.  Plan until next visit: Continue medication as prescribed  Call for questions or concerns Return in about 1 year (around 01/04/2024).  The medication list was reviewed and reconciled. No changes were made in the prescribed medications today. A complete medication list was provided to the patient.  Allergies as of 01/04/2023   No Known Allergies      Medication List        Accurate as of Jan 04, 2023 11:59 PM. If you have any questions, ask your nurse or doctor.          amphetamine-dextroamphetamine 30 MG tablet Commonly known as: ADDERALL TAKE ONE TABLET EVERY MORNING AND 1/2 TABLET EVERY AFTERNOON Start taking on: Jan 25, 2023 What changed: These instructions start on Jan 25, 2023. If you are unsure what to do until then, ask  your doctor or other care provider. Changed by: Elveria Rising, NP   amphetamine-dextroamphetamine 30 MG tablet Commonly known as: Adderall Take 1 tablet in the morning and take 1/2 tablet at lunch Start taking on: February 23, 2023 What changed: You were already taking a medication with the same name, and this prescription was added. Make sure you understand how and when to take each. Changed by: Elveria Rising, NP      Total time spent with the patient was 20 minutes, of which 50% or more was spent in counseling and coordination of care.  Elveria Rising NP-C [facility] [address], [address] [place], [place] [address] Ph. [phone] Fax [phone]

## 2023-01-07 NOTE — Patient Instructions (Signed)
It was a pleasure to see you today!  Instructions for you until your next appointment are as follows: Continue taking the Adderall as prescribed Call for questions or concerns Please sign up for MyChart if you have not done so. Please plan to return for follow up in one year or sooner if needed.   Feel free to contact our office during normal business hours at [phone] with questions or concerns. If there is no answer or the call is outside business hours, please leave a message and our clinic staff will call you back within the next business day.  If you have an urgent concern, please stay on the line for our after-hours answering service and ask for the on-call neurologist.     I also encourage you to use MyChart to communicate with me more directly. If you have not yet signed up for MyChart within [facility], the front desk staff can help you. However, please note that this inbox is NOT monitored on nights or weekends, and response can take up to 2 business days.  Urgent matters should be discussed with the on-call pediatric neurologist.   At [facility], we are committed to providing exceptional care. You will receive a patient satisfaction survey through text or email regarding your visit today. Your opinion is important to me. Comments are appreciated.

## 2023-04-24 ENCOUNTER — Other Ambulatory Visit (INDEPENDENT_AMBULATORY_CARE_PROVIDER_SITE_OTHER): Payer: Self-pay | Admitting: Family

## 2023-04-24 DIAGNOSIS — F901 Attention-deficit hyperactivity disorder, predominantly hyperactive type: Secondary | ICD-10-CM

## 2023-04-24 MED ORDER — AMPHETAMINE-DEXTROAMPHETAMINE 30 MG PO TABS
ORAL_TABLET | ORAL | 0 refills | Status: DC
Start: 2023-05-22 — End: 2023-06-26

## 2023-04-24 MED ORDER — AMPHETAMINE-DEXTROAMPHETAMINE 30 MG PO TABS: ORAL_TABLET | ORAL | 0 refills | Status: DC

## 2023-06-26 ENCOUNTER — Other Ambulatory Visit (INDEPENDENT_AMBULATORY_CARE_PROVIDER_SITE_OTHER): Payer: Self-pay | Admitting: Family

## 2023-06-26 DIAGNOSIS — F901 Attention-deficit hyperactivity disorder, predominantly hyperactive type: Secondary | ICD-10-CM

## 2023-06-26 MED ORDER — AMPHETAMINE-DEXTROAMPHETAMINE 30 MG PO TABS: ORAL_TABLET | ORAL | 0 refills | Status: DC

## 2023-06-26 MED ORDER — AMPHETAMINE-DEXTROAMPHETAMINE 30 MG PO TABS
ORAL_TABLET | ORAL | 0 refills | Status: DC
Start: 2023-07-24 — End: 2023-09-28

## 2023-06-26 NOTE — Telephone Encounter (Signed)
Contacted [facility] and spoke to [place].   Rosalita Chessman stated that they aren't able to get the RX transferred due to it being a controlled substance.  Contacted patient. Verified patients DOB. Patient informed me that his insurance has changed and they [facility] will not accept his insurance. And would like for [facility] precriptions [facility] to [facility].   I verbalized understanding of this. Informed patient that I would ask Mrs. Inetta Fermo to change the pharmacy.  SS, CCMA

## 2023-06-26 NOTE — Telephone Encounter (Signed)
  Name of who is calling: Rosalita Chessman or Pharmacist Sarah @ [facility]  Caller's Relationship to Patient:  Best contact number: [phone]  Provider they see: Elveria Rising  Reason for call: pharmacist was on the phone with mom they want to have their prescription filled here at [facility] instead of [facility], want , please contact back in ref to this     PRESCRIPTION REFILL ONLY  Name of prescription:amphetamine-dextroamphetamine (ADDERALL) 30 MG table   Pharmacy:

## 2023-09-28 ENCOUNTER — Other Ambulatory Visit (INDEPENDENT_AMBULATORY_CARE_PROVIDER_SITE_OTHER): Payer: Self-pay | Admitting: Family

## 2023-09-28 DIAGNOSIS — F901 Attention-deficit hyperactivity disorder, predominantly hyperactive type: Secondary | ICD-10-CM

## 2023-10-26 ENCOUNTER — Other Ambulatory Visit (INDEPENDENT_AMBULATORY_CARE_PROVIDER_SITE_OTHER): Payer: Self-pay | Admitting: Family

## 2023-10-26 DIAGNOSIS — F901 Attention-deficit hyperactivity disorder, predominantly hyperactive type: Secondary | ICD-10-CM

## 2023-11-29 ENCOUNTER — Encounter (INDEPENDENT_AMBULATORY_CARE_PROVIDER_SITE_OTHER): Payer: Self-pay | Admitting: Family

## 2023-11-29 ENCOUNTER — Ambulatory Visit (INDEPENDENT_AMBULATORY_CARE_PROVIDER_SITE_OTHER): Payer: Commercial Managed Care - PPO | Admitting: Family

## 2023-11-29 VITALS — BP 124/70 | HR 62 | Ht 65.35 in | Wt 144.2 lb

## 2023-11-29 DIAGNOSIS — F901 Attention-deficit hyperactivity disorder, predominantly hyperactive type: Secondary | ICD-10-CM | POA: Diagnosis not present

## 2023-11-29 MED ORDER — AMPHETAMINE-DEXTROAMPHETAMINE 30 MG PO TABS
ORAL_TABLET | ORAL | 0 refills | Status: DC
Start: 2023-11-29 — End: 2023-12-26

## 2023-11-29 NOTE — Patient Instructions (Addendum)
 It was a pleasure to see you today. I am sorry to hear of your mother's passing.  I sent in the refill to [facility] for your medication.   Here are some options for ADHD treatment [email or web address] - [facility] ph [phone] [facility] ph [phone] Let me know if you are able to get an appointment with one of these facilities Please sign up for MyChart if you have not done so. I will manage refills until you get established with a new provider  Feel free to contact our office during normal business hours at [phone] with questions or concerns. If there is no answer or the call is outside business hours, please leave a message and our clinic staff will call you back within the next business day.  If you have an urgent concern, please stay on the line for our after-hours answering service and ask for the on-call neurologist.     I also encourage you to use MyChart to communicate with me more directly. If you have not yet signed up for MyChart within [facility], the front desk staff can help you. However, please note that this inbox is NOT monitored on nights or weekends, and response can take up to 2 business days.  Urgent matters should be discussed with the on-call pediatric neurologist.   At [facility], we are committed to providing exceptional care. You will receive a patient satisfaction survey through text or email regarding your visit today. Your opinion is important to me. Comments are appreciated.

## 2023-11-29 NOTE — Progress Notes (Signed)
 Eric Zuniga   MRN:  [id number]  [birth year]   Provider: Elveria Rising NP-C Location of Care: [facility]  Visit type: Return visit  Last visit: 01/04/2023  Referral source: Patient, No Pcp Per History from: Epic chart and patient  Brief history:  Copied from previous record: History of attention deficit disorder, inattentive type and migraine headaches. He is taking and tolerating generic Adderall IR, which has worked well for many years to focus his attention. He has intermittent migraines, which requires medication and sleep to obtain relief.   Today's concerns: He reports today that the generic Adderall continues to work well to focus his attention for the work day. He has no side effects.  Eric Zuniga's mother passed away a month ago. He is understandably still struggling with that Eric has been otherwise generally healthy since he was last seen. No health concerns today other than previously mentioned.  Review of systems: Please see HPI for neurologic and other pertinent review of systems. Otherwise all other systems were reviewed and were negative.  Problem List: Patient Active Problem List   Diagnosis Date Noted   Attention-deficit hyperactivity disorder, predominantly hyperactive type 01/07/2023   Attention deficit disorder predominant inattentive type 08/25/2014   Attention deficit hyperactivity disorder (ADHD) 07/18/2013   Migraine without aura 07/18/2013     Past Medical History:  Diagnosis Date   Headache(784.0)     Past medical history comments: See HPI Copied from previous record: Patient suffered a head injury at the age of 8 due to a fall. He had an injury to his thumb 2014 while using a wood splitter that required surgical repair.  Surgical history: Past Surgical History:  Procedure Laterality Date   REPLANTATION THUMB Left Aug. 2014    Family history: family history includes Alzheimer's disease in  his paternal grandmother; Heart attack in his paternal grandfather.   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 He enjoys skiing, soccer, and golf.    Social Drivers of Corporate investment banker Strain: Not on file  Food Insecurity: Not on file  Transportation Needs: Not on file  Physical Activity: Not on file  Stress: Not on file  Social Connections: Not on file  Intimate Partner Violence: Not on file    Past/failed meds:  Allergies: No Known Allergies   Immunizations:  There is no immunization history on file for this patient.   Diagnostics/Screenings:  Physical Exam: BP 124/70   Pulse 62   Ht 5' 5.35" (1.66 m)   Wt 144 lb 3.2 oz (65.4 kg)   BMI 23.74 kg/m   General: Well developed, well nourished man, seated on exam table, in no evident distress Head: Head normocephalic and atraumatic.  Oropharynx benign. Neck: Supple Cardiovascular: Regular rate and rhythm, no murmurs Respiratory: Breath sounds clear to  auscultation Musculoskeletal: No obvious deformities or scoliosis Skin: No rashes or neurocutaneous lesions  Neurologic Exam Mental Status: Awake and fully alert.  Oriented to place and time.  Recent and remote memory intact.  Attention span, concentration, and fund of knowledge appropriate.  Mood and affect appropriate. Cranial Nerves: Fundoscopic exam reveals sharp disc margins.  Pupils equal, briskly reactive to  light.  Extraocular movements full without nystagmus. Hearing intact and symmetric to whisper.  Facial sensation intact.  Face tongue, palate move normally and symmetrically. Shoulder shrug normal Motor: Normal bulk and tone. Normal strength in all tested extremity muscles. Sensory: Intact to touch and temperature in all extremities.  Coordination: Rapid alternating movements normal in all extremities.  Finger-to-nose and heel-to shin performed accurately bilaterally.  Romberg negative. Gait and Station: Arises from chair without difficulty.  Stance is normal. Gait demonstrates normal stride length and balance.   Able to heel, toe and tandem walk without difficulty. Reflexes: 1+ and symmetric. Toes downgoing.   Impression: Attention-deficit hyperactivity disorder, predominantly hyperactive type - Plan: amphetamine-dextroamphetamine (ADDERALL) 30 MG tablet   Recommendations for plan of care: The patient's previous Epic records were reviewed. No recent diagnostic studies to be reviewed with the patient. I talked with Eric about transfer of care to an adult provider for ADHD and gave him some options to contact. He will let me know when he is established with that provider.  Plan until next visit: Patient to contact adult ADHD provider - [email or web address] or [email or web address] I will manage refills until he is established with new provider Continue medications as prescribed  Call for questions or concerns  The medication list was reviewed and reconciled. No changes were made in the prescribed medications today. A complete medication list was provided to the patient.  Allergies as of 11/29/2023   No Known Allergies      Medication List        Accurate as of November 29, 2023 11:59 PM. If you have any questions, ask your nurse or doctor.          amphetamine-dextroamphetamine 30 MG tablet Commonly known as: ADDERALL TAKE ONE TABLET BY MOUTH EVERY MORNING AND TAKE  ONE-HALF TABLET BY MOUTH EVERY AFTERNOON AS DIRECTED      Total time spent with the patient was 25 minutes, of which 50% or more was spent in counseling and coordination of care.  Elveria Rising NP-C [facility] [address], [address] [place], [place] [address] Ph. [phone] Fax [phone]

## 2023-12-01 ENCOUNTER — Encounter (INDEPENDENT_AMBULATORY_CARE_PROVIDER_SITE_OTHER): Payer: Self-pay | Admitting: Family

## 2023-12-26 ENCOUNTER — Other Ambulatory Visit (INDEPENDENT_AMBULATORY_CARE_PROVIDER_SITE_OTHER): Payer: Self-pay | Admitting: Family

## 2023-12-26 DIAGNOSIS — F901 Attention-deficit hyperactivity disorder, predominantly hyperactive type: Secondary | ICD-10-CM

## 2024-01-28 ENCOUNTER — Other Ambulatory Visit (INDEPENDENT_AMBULATORY_CARE_PROVIDER_SITE_OTHER): Payer: Self-pay | Admitting: Family

## 2024-01-28 DIAGNOSIS — F901 Attention-deficit hyperactivity disorder, predominantly hyperactive type: Secondary | ICD-10-CM

## 2024-02-27 ENCOUNTER — Other Ambulatory Visit (INDEPENDENT_AMBULATORY_CARE_PROVIDER_SITE_OTHER): Payer: Self-pay | Admitting: Family

## 2024-02-27 DIAGNOSIS — F901 Attention-deficit hyperactivity disorder, predominantly hyperactive type: Secondary | ICD-10-CM
